# Patient Record
Sex: Female | Born: 1985 | Race: White | Hispanic: No | Marital: Married | Smoking: Former smoker
Health system: Southern US, Community
[De-identification: ages and names within clinical notes are randomized; demographics above are authoritative.]

## PROBLEM LIST (undated history)

## (undated) DIAGNOSIS — K589 Irritable bowel syndrome without diarrhea: Secondary | ICD-10-CM

## (undated) DIAGNOSIS — Z8041 Family history of malignant neoplasm of ovary: Secondary | ICD-10-CM

## (undated) DIAGNOSIS — O24419 Gestational diabetes mellitus in pregnancy, unspecified control: Secondary | ICD-10-CM

## (undated) DIAGNOSIS — J302 Other seasonal allergic rhinitis: Secondary | ICD-10-CM

## (undated) DIAGNOSIS — D696 Thrombocytopenia, unspecified: Secondary | ICD-10-CM

## (undated) DIAGNOSIS — Z1371 Encounter for nonprocreative screening for genetic disease carrier status: Secondary | ICD-10-CM

## (undated) DIAGNOSIS — T753XXA Motion sickness, initial encounter: Secondary | ICD-10-CM

## (undated) HISTORY — DX: Encounter for nonprocreative screening for genetic disease carrier status: Z13.71

## (undated) HISTORY — DX: Family history of malignant neoplasm of ovary: Z80.41

---
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
## 2007-01-15 ENCOUNTER — Emergency Department: Payer: Self-pay | Admitting: Unknown Physician Specialty

## 2007-04-12 ENCOUNTER — Observation Stay: Payer: Self-pay | Admitting: Obstetrics and Gynecology

## 2007-05-26 ENCOUNTER — Observation Stay: Payer: Self-pay | Admitting: Obstetrics and Gynecology

## 2007-05-29 ENCOUNTER — Inpatient Hospital Stay: Payer: Self-pay

## 2008-08-17 IMAGING — US US OB US >=[ID] SNGL FETUS
1 series · 17 of 28 positions shown · non-contrast
Comparison: none

REASON FOR EXAM: Vaginal bleeding
COMMENTS:

[Series 1: us ob us >=(id) sngl fetus · 17 of 55 slices shown]
[im 1/55]
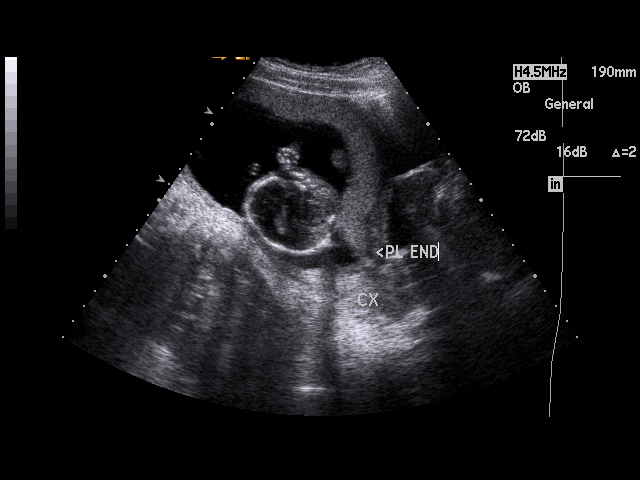
[im 5/55]
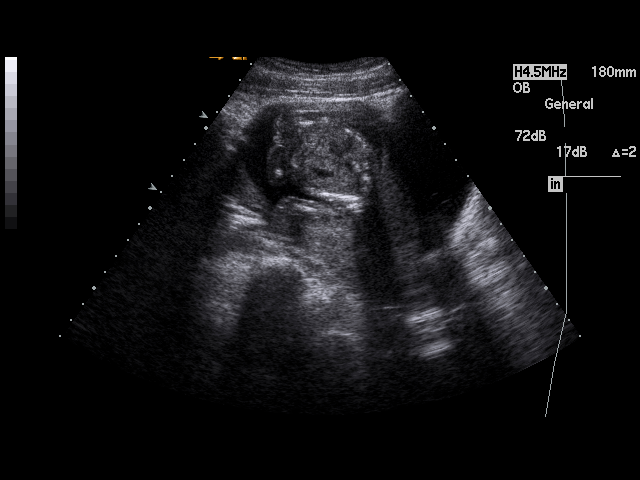
[im 9/55]
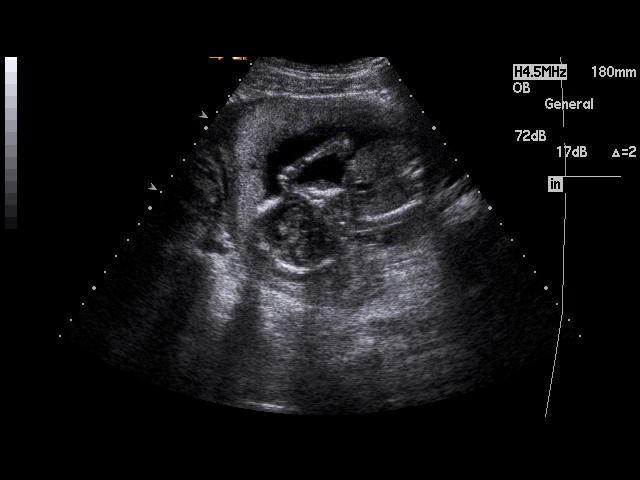
[im 11/55]
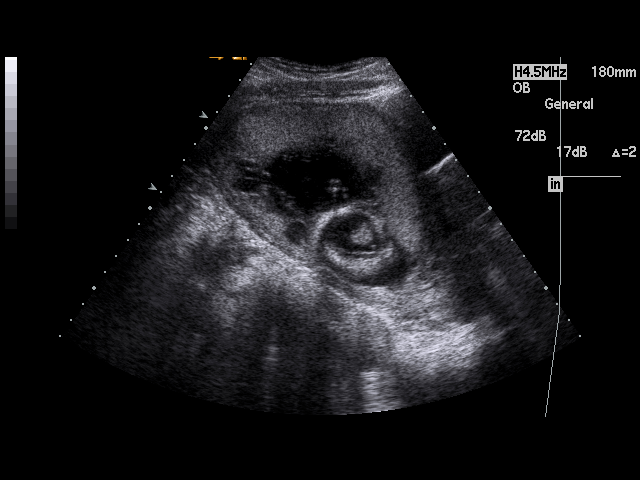
[im 15/55]
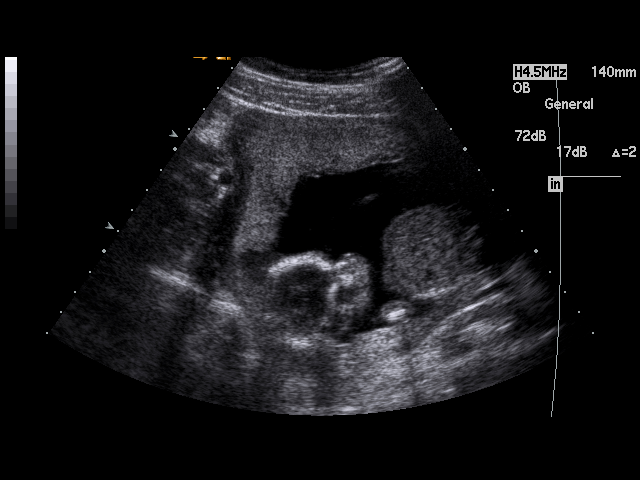
[im 19/55]
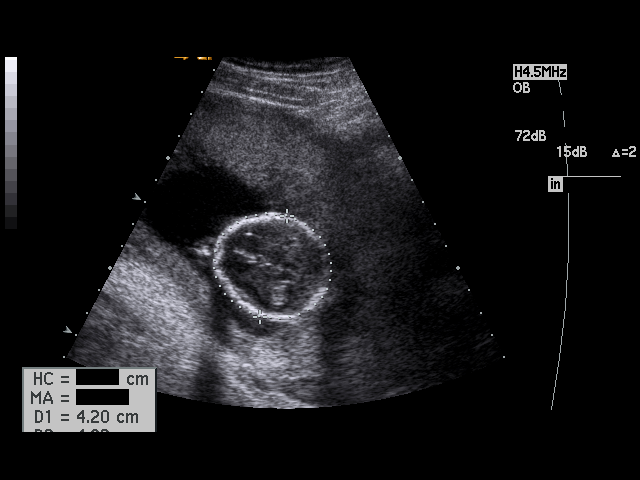
[im 21/55]
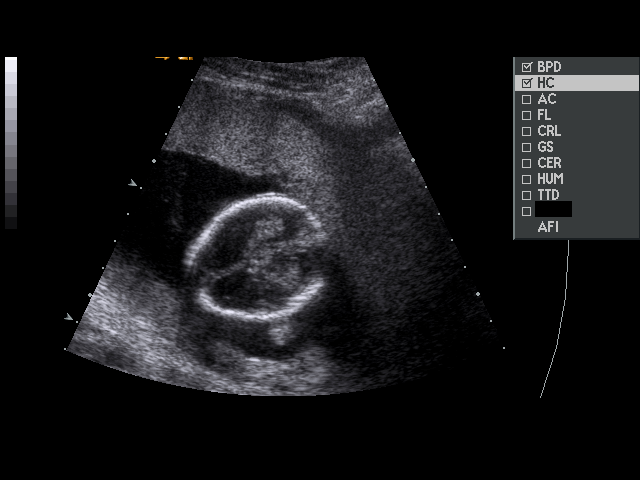
[im 25/55]
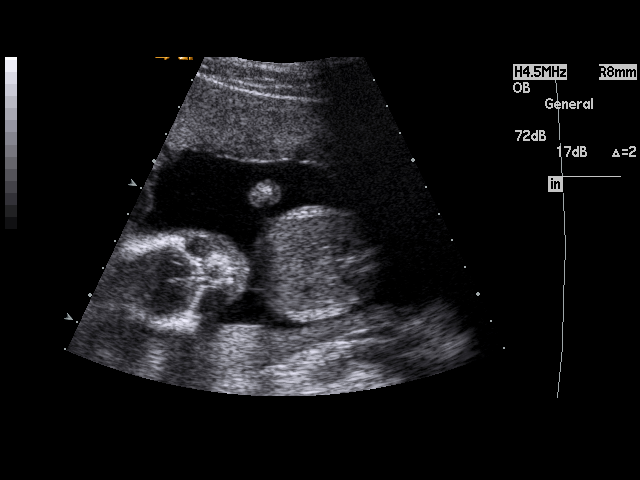
[im 29/55]
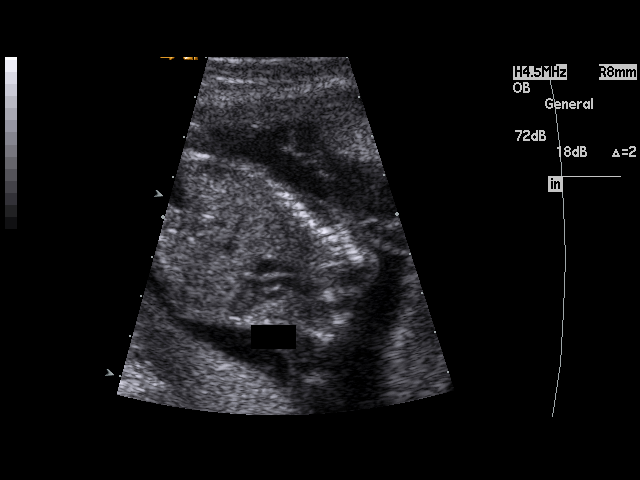
[im 31/55]
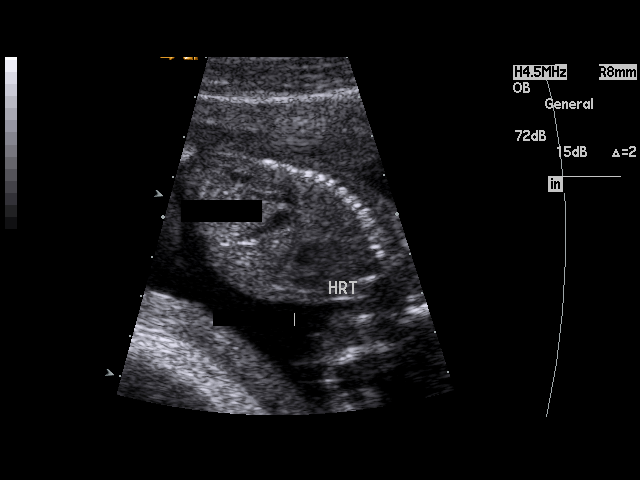
[im 35/55]
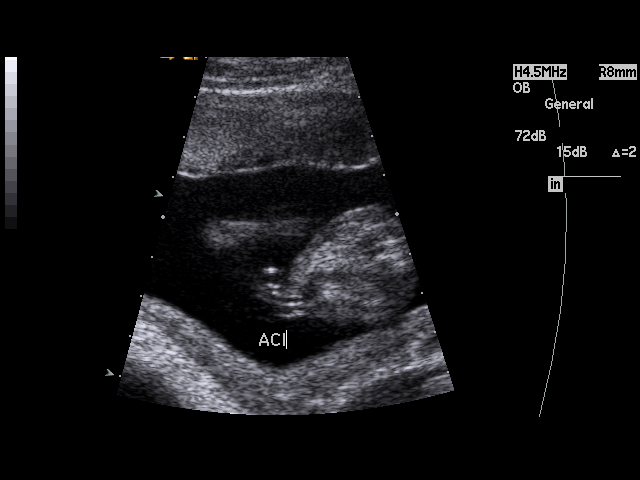
[im 37/55]
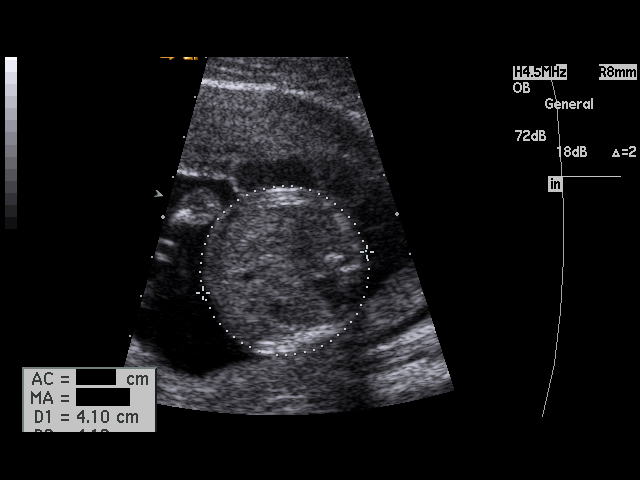
[im 41/55]
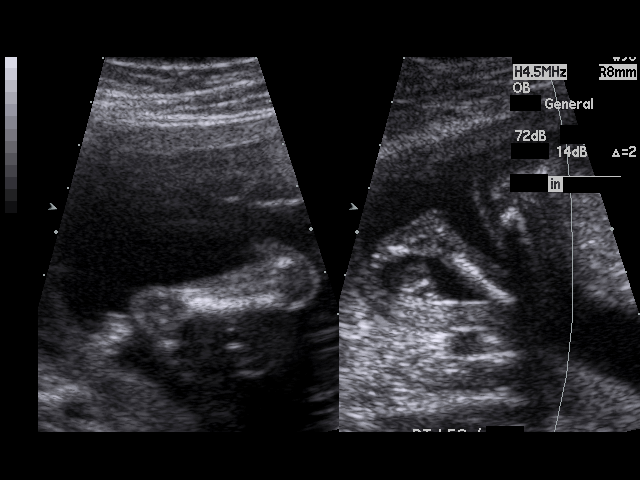
[im 45/55]
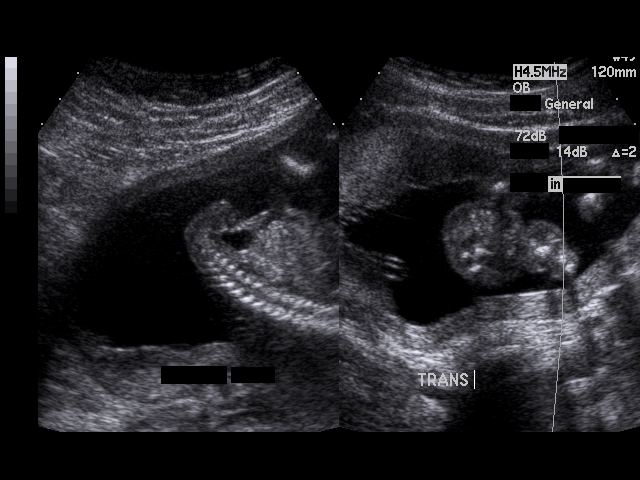
[im 47/55]
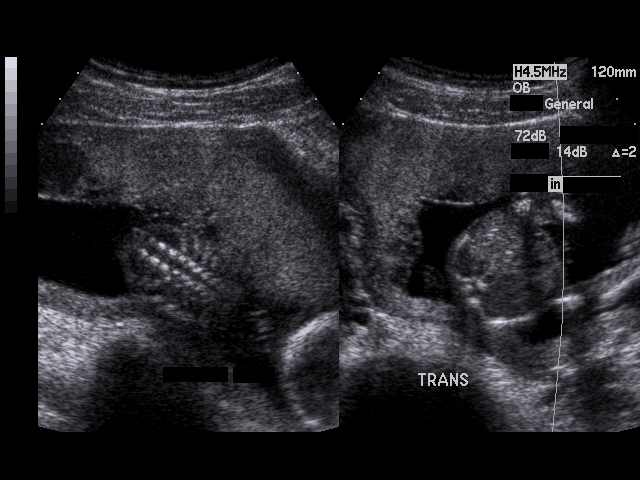
[im 51/55]
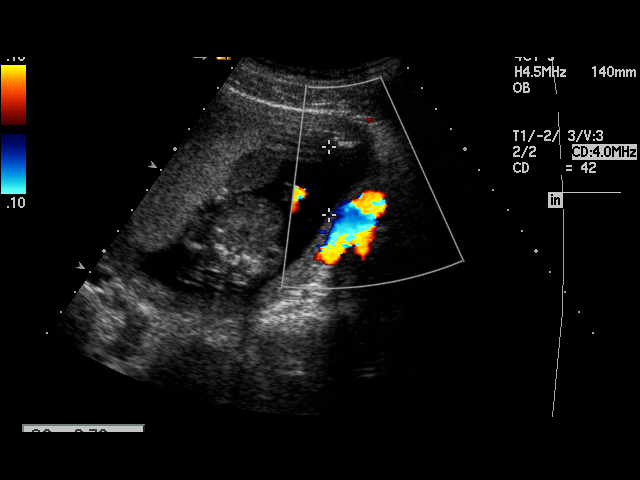
[im 55/55]
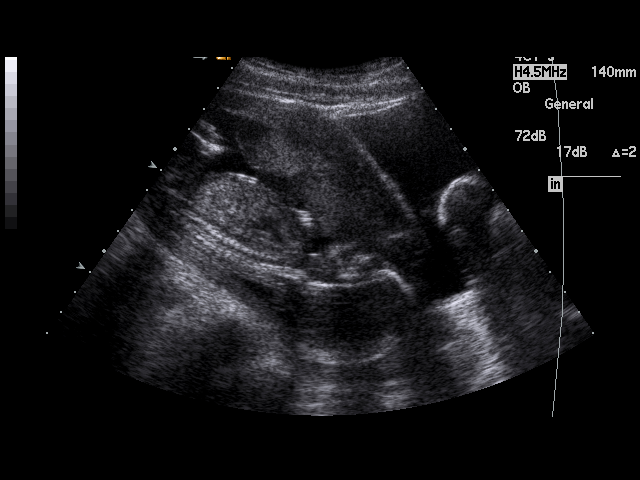

[17 of 28 positions shown; findings below may reference images not displayed]

PROCEDURE:     US  - US OB GREATER/OR EQUAL TO A7X0B  - January 15, 2007 [DATE]

RESULT:     There is a living, intrauterine gestation. Fetal heart rate was
monitored at 144 beats per minute. Amniotic fluid volume appears normal.
Presentation currently is cephalic. The placenta is anterior and extends
inferiorly to the level of the cervix consistent with marginal placenta
previa. The fetal heart, stomach and urinary bladder are visualized. No
hydrocephalus or hydronephrosis is seen.

Fetal measurements are as follows:

          BPD:   3.98 cm  (18 weeks, 1 day)
            HC:  14.18 cm (17 weeks, 3 days)
            AC:  12.91 cm (18 weeks, 3 days)
            FL:     2.72 cm (18 weeks, 2 days)

EFW equals 259 grams. AFI equals 11.77 cm. Average ultrasound age is 18
weeks, 0 days. Ultrasound EDD is 06/18/2007.
IMPRESSION: 1.  Single, living intrauterine gestation of approximately 18 weeks
gestational age.
2.  The placenta is anterior and extends inferiorly to the level of the
cervix consistent with marginal placenta previa. Follow-up examination to
monitor placenta position as the pregnancy progresses is recommended since
so-called placenta migration frequently occurs during the course of
pregnancy.

## 2011-08-06 HISTORY — PX: TUBAL LIGATION: SHX77

## 2012-06-17 ENCOUNTER — Observation Stay: Payer: Self-pay | Admitting: Obstetrics and Gynecology

## 2012-06-17 LAB — URINALYSIS, COMPLETE
Ketone: NEGATIVE
Nitrite: NEGATIVE
Ph: 6 (ref 4.5–8.0)
Protein: NEGATIVE
RBC,UR: 1 /HPF (ref 0–5)
Squamous Epithelial: 13

## 2012-06-18 LAB — URINE CULTURE

## 2012-06-29 ENCOUNTER — Observation Stay: Payer: Self-pay

## 2012-07-07 ENCOUNTER — Observation Stay: Payer: Self-pay | Admitting: Obstetrics and Gynecology

## 2012-07-07 LAB — PIH PROFILE
BUN: 7 mg/dL (ref 7–18)
Chloride: 109 mmol/L — ABNORMAL HIGH (ref 98–107)
Co2: 23 mmol/L (ref 21–32)
Creatinine: 0.57 mg/dL — ABNORMAL LOW (ref 0.60–1.30)
Glucose: 78 mg/dL (ref 65–99)
HCT: 32.9 % — ABNORMAL LOW (ref 35.0–47.0)
MCH: 28.3 pg (ref 26.0–34.0)
MCHC: 34.1 g/dL (ref 32.0–36.0)
Osmolality: 274 (ref 275–301)
Platelet: 86 10*3/uL — ABNORMAL LOW (ref 150–440)
RBC: 3.96 10*6/uL (ref 3.80–5.20)
RDW: 14.2 % (ref 11.5–14.5)
Sodium: 139 mmol/L (ref 136–145)
Uric Acid: 3.7 mg/dL (ref 2.6–6.0)
WBC: 8 10*3/uL (ref 3.6–11.0)

## 2012-07-07 LAB — PROTEIN / CREATININE RATIO, URINE
Creatinine, Urine: 86.3 mg/dL (ref 30.0–125.0)
Protein, Random Urine: 18 mg/dL — ABNORMAL HIGH (ref 0–12)
Protein/Creat. Ratio: 209 mg/gCREAT — ABNORMAL HIGH (ref 0–200)

## 2012-07-07 LAB — URINALYSIS, COMPLETE
Ketone: NEGATIVE
Ph: 6 (ref 4.5–8.0)
Protein: NEGATIVE
Squamous Epithelial: 2

## 2012-07-18 ENCOUNTER — Emergency Department: Payer: Self-pay | Admitting: Emergency Medicine

## 2012-07-18 LAB — COMPREHENSIVE METABOLIC PANEL
Albumin: 3.5 g/dL (ref 3.4–5.0)
Anion Gap: 9 (ref 7–16)
BUN: 10 mg/dL (ref 7–18)
Bilirubin,Total: 0.3 mg/dL (ref 0.2–1.0)
Chloride: 114 mmol/L — ABNORMAL HIGH (ref 98–107)
Co2: 22 mmol/L (ref 21–32)
Creatinine: 0.81 mg/dL (ref 0.60–1.30)
EGFR (African American): >60
EGFR (Non-African Amer.): >60
Osmolality: 289 (ref 275–301)
Potassium: 3.8 mmol/L (ref 3.5–5.1)
SGPT (ALT): 41 U/L (ref 12–78)
Sodium: 145 mmol/L (ref 136–145)
Total Protein: 7.8 g/dL (ref 6.4–8.2)

## 2012-07-18 LAB — URINALYSIS, COMPLETE
Bilirubin,UR: NEGATIVE
Glucose,UR: NEGATIVE mg/dL (ref 0–75)
Nitrite: NEGATIVE
RBC,UR: 1335 /HPF (ref 0–5)
Squamous Epithelial: 12
WBC UR: 44 /HPF (ref 0–5)

## 2012-07-18 LAB — CBC
HCT: 41.4 % (ref 35.0–47.0)
HGB: 13.3 g/dL (ref 12.0–16.0)
MCV: 84 fL (ref 80–100)
Platelet: 240 10*3/uL (ref 150–440)
RBC: 4.94 10*6/uL (ref 3.80–5.20)
RDW: 15 % — ABNORMAL HIGH (ref 11.5–14.5)
WBC: 5.7 10*3/uL (ref 3.6–11.0)

## 2012-07-18 LAB — CLOSTRIDIUM DIFFICILE BY PCR

## 2014-12-13 NOTE — H&P (Signed)
L&D Evaluation:  History:   HPI 29 year old G1 P0101 with EDC=07/21/2012 presents at 5536 6/7 weeks with c/o cold symptoms x 1 mos (congestion, rhinorrhea, sore throat, and cough) that have worsened in the last 4-5 days to also include yellow thick nasal discharge and right ear pain. Baby has been active. Has had more frequent contractions since last week-but mild-with no cervical change. [facility] for GDMA1-has stopped doing her blood sugars at home. She has a past OB history remarkable for preeclampsia at 34 weeks requiring IOL. She denies a history of asthma, but does have seasonal allergies    Presents with URI    Patient's Medical History Preeclampsia first pregnancy. GDMA1 this pregnancy.    Patient's Surgical History Tonsilectomy, lacrimal duct surgery as an infant    Medications Pre Natal Vitamins    Allergies CECLOR    Social History none  Former smoker    Family History Youngest child had cold   ROS:   ROS see HPI   Exam:   Vital Signs stable  98.2-86-20 114/75    Urine Protein trace    General no apparent distress    Mental Status clear    Chest clear    Heart normal sinus rhythm    Abdomen gravid, non-tender    Pelvic no external lesions, 1.5/50%/-2 ballotable per RN exam    Mebranes Intact    FHT normal rate with no decels, 135 baseline with accels to 160s to 170s    FHT Description mod variability    Fetal Heart Rate 135    Ucx absent    Skin dry    Other TMs: fluid behind right TM and no pain when moving pinnea. Left TM with good cone reflex and noninflammed. OP-no inflammation or exudates. Nasal turbinates-inflammed. No maxillary or frontal sinus pressure.   Impression:   Impression IUP at 36 6/7 weeks with URI. Reactive NST.   Plan:   Plan DC home with a Z-PAK. Recommend Mucinex for cough, and Afrin BID x 3-5 days only to help open the ET on the right. Can use saline nasal spray and Vicks also as well as  cough drops. [facility].   Electronic Signatures: Trinna BalloonGutierrez, Mykenzie Ebanks L (CNM)  (Signed [phone]-Nov-13 22:31)  Authored: L&D Evaluation   Last Updated: 25-Nov-13 22:31 by Trinna BalloonGutierrez, Barby Colvard L (CNM)

## 2014-12-13 NOTE — H&P (Signed)
L&D Evaluation:  History Expanded:   HPI 29 year old G2 23P0101 with EDC=07/21/2012 presents at 38 weeks with c/o menstrual-like cramps, back pain, nausea and headache. [facility] with c/o headache, not relieved by Fioricet, ice, B2 and several other measures. [facility] GDMA1 (pt has not been checking her blood sugars at home), she reports she has not kept her prenatal appts in several weeks d/t being unsatisfied with care. Has appt scheduled this Friday. She has a past OB history remarkable for preeclampsia at 34 weeks requiring IOL.    Presents with abdominal pain, back pain    Patient's Medical History Preeclampsia first pregnancy. GDMA1 this pregnancy.    Patient's Surgical History Tonsilectomy, lacrimal duct surgery as an infant    Medications Pre Natal Vitamins    Allergies CECLOR    Social History none  Former smoker   ROS:   ROS see HPI   Exam:   Vital Signs stable    Urine Protein negative dipstick    General no apparent distress    Mental Status clear    Abdomen gravid, non-tender    Back CVAT    Pelvic no external lesions, 2/25/-3    Mebranes Intact    FHT normal rate with no decels, category 1    FHT Description mod variability    Ucx irregular, absent    Skin dry    Other Fasting glucose: 80   Impression:   Impression IUP at 38 weeks with discomforts of term pregnancy   Plan:   Plan UA    Comments Check UA as pt reports flank pain  Will give Zofran and diabetic breakfast tray to see if this helps with c/o headache   Electronic Signatures: Vella KohlerBrothers, Danasia Baker K (CNM)  (Signed 03-Dec-13 11:08)  Authored: L&D Evaluation   Last Updated: 03-Dec-13 11:08 by Vella KohlerBrothers, Antawn Sison K (CNM)

## 2014-12-21 ENCOUNTER — Encounter: Payer: Self-pay | Admitting: *Deleted

## 2014-12-23 ENCOUNTER — Other Ambulatory Visit: Payer: Self-pay | Admitting: Gastroenterology

## 2014-12-23 ENCOUNTER — Encounter
Admission: RE | Disposition: A | Discharge status: Home / Self Care | Payer: Self-pay | Source: Ambulatory Visit | Attending: Gastroenterology

## 2014-12-23 ENCOUNTER — Ambulatory Visit: Payer: Managed Care, Other (non HMO) | Admitting: Anesthesiology

## 2014-12-23 ENCOUNTER — Ambulatory Visit
Admission: RE | Admit: 2014-12-23 | Discharge: 2014-12-23 | Disposition: A | Discharge status: Home / Self Care | Payer: Managed Care, Other (non HMO) | Source: Ambulatory Visit | Attending: Gastroenterology | Admitting: Gastroenterology

## 2014-12-23 DIAGNOSIS — Z833 Family history of diabetes mellitus: Secondary | ICD-10-CM | POA: Insufficient documentation

## 2014-12-23 DIAGNOSIS — K640 First degree hemorrhoids: Secondary | ICD-10-CM | POA: Diagnosis not present

## 2014-12-23 DIAGNOSIS — Z888 Allergy status to other drugs, medicaments and biological substances status: Secondary | ICD-10-CM | POA: Insufficient documentation

## 2014-12-23 DIAGNOSIS — K921 Melena: Secondary | ICD-10-CM | POA: Insufficient documentation

## 2014-12-23 DIAGNOSIS — Z800 Family history of malignant neoplasm of digestive organs: Secondary | ICD-10-CM | POA: Diagnosis not present

## 2014-12-23 DIAGNOSIS — Z808 Family history of malignant neoplasm of other organs or systems: Secondary | ICD-10-CM | POA: Insufficient documentation

## 2014-12-23 DIAGNOSIS — K589 Irritable bowel syndrome without diarrhea: Secondary | ICD-10-CM | POA: Diagnosis present

## 2014-12-23 DIAGNOSIS — Z87891 Personal history of nicotine dependence: Secondary | ICD-10-CM | POA: Diagnosis not present

## 2014-12-23 HISTORY — DX: Motion sickness, initial encounter: T75.3XXA

## 2014-12-23 HISTORY — DX: Irritable bowel syndrome, unspecified: K58.9

## 2014-12-23 HISTORY — DX: Thrombocytopenia, unspecified: D69.6

## 2014-12-23 HISTORY — PX: COLONOSCOPY: SHX5424

## 2014-12-23 HISTORY — DX: Gestational diabetes mellitus in pregnancy, unspecified control: O24.419

## 2014-12-23 HISTORY — DX: Other seasonal allergic rhinitis: J30.2

## 2014-12-23 SURGERY — COLONOSCOPY
Anesthesia: Monitor Anesthesia Care | Wound class: Contaminated

## 2014-12-23 MED ORDER — LACTATED RINGERS IV SOLN
INTRAVENOUS | Status: DC
  Administered 2014-12-23: 11:00:00 via INTRAVENOUS

## 2014-12-23 MED ORDER — ACETAMINOPHEN 325 MG PO TABS: 325.0000 mg | ORAL_TABLET | ORAL | Status: DC | PRN

## 2014-12-23 MED ORDER — PROPOFOL 10 MG/ML IV BOLUS
INTRAVENOUS | Status: DC | PRN
  Administered 2014-12-23 (×2): 40 mg via INTRAVENOUS
  Administered 2014-12-23: 20 mg via INTRAVENOUS
  Administered 2014-12-23: 40 mg via INTRAVENOUS
  Administered 2014-12-23 (×2): 50 mg via INTRAVENOUS

## 2014-12-23 MED ORDER — LIDOCAINE HCL (CARDIAC) 20 MG/ML IV SOLN
INTRAVENOUS | Status: DC | PRN
  Administered 2014-12-23: 50 mg via INTRAVENOUS

## 2014-12-23 MED ORDER — SIMETHICONE 40 MG/0.6ML PO SUSP
ORAL | Status: DC | PRN
  Administered 2014-12-23: 11:00:00

## 2014-12-23 MED ORDER — SODIUM CHLORIDE 0.9 % IV SOLN: INTRAVENOUS | Status: DC

## 2014-12-23 MED ORDER — DEXAMETHASONE SODIUM PHOSPHATE 4 MG/ML IJ SOLN: 8.0000 mg | Freq: Once | INTRAMUSCULAR | Status: DC | PRN

## 2014-12-23 MED ORDER — OXYCODONE HCL 5 MG PO TABS: 5.0000 mg | ORAL_TABLET | Freq: Once | ORAL | Status: DC | PRN

## 2014-12-23 MED ORDER — ACETAMINOPHEN 160 MG/5ML PO SOLN: 325.0000 mg | ORAL | Status: DC | PRN

## 2014-12-23 MED ORDER — OXYCODONE HCL 5 MG/5ML PO SOLN: 5.0000 mg | Freq: Once | ORAL | Status: DC | PRN

## 2014-12-23 MED ORDER — FENTANYL CITRATE (PF) 100 MCG/2ML IJ SOLN
25.0000 ug | INTRAMUSCULAR | Status: DC | PRN
Start: 2014-12-23 — End: 2014-12-23

## 2014-12-23 SURGICAL SUPPLY — 27 items
CANISTER SUCT 1200ML W/VALVE (MISCELLANEOUS) ×3 IMPLANT
FCP ESCP3.2XJMB 240X2.8X (MISCELLANEOUS)
FORCEPS BIOP RAD 4 LRG CAP 4 (CUTTING FORCEPS) ×3 IMPLANT
FORCEPS BIOP RJ4 240 W/NDL (MISCELLANEOUS)
FORCEPS ESCP3.2XJMB 240X2.8X (MISCELLANEOUS) IMPLANT
GOWN CVR UNV OPN BCK APRN NK (MISCELLANEOUS) ×2 IMPLANT
GOWN ISOL THUMB LOOP REG UNIV (MISCELLANEOUS) ×4
HEMOCLIP INSTINCT (CLIP) IMPLANT
INJECTOR VARIJECT VIN23 (MISCELLANEOUS) IMPLANT
KIT CO2 TUBING (TUBING) ×3 IMPLANT
KIT DEFENDO VALVE AND CONN (KITS) IMPLANT
KIT ENDO PROCEDURE OLY (KITS) ×3 IMPLANT
LIGATOR MULTIBAND 6SHOOTER MBL (MISCELLANEOUS) IMPLANT
MARKER SPOT ENDO TATTOO 5ML (MISCELLANEOUS) IMPLANT
PAD GROUND ADULT SPLIT (MISCELLANEOUS) IMPLANT
SNARE SHORT THROW 13M SML OVAL (MISCELLANEOUS) IMPLANT
SNARE SHORT THROW 30M LRG OVAL (MISCELLANEOUS) IMPLANT
SPOT EX ENDOSCOPIC TATTOO (MISCELLANEOUS)
SUCTION POLY TRAP 4CHAMBER (MISCELLANEOUS) IMPLANT
TRAP SUCTION POLY (MISCELLANEOUS) IMPLANT
TUBING CONN 6MMX3.1M (TUBING)
TUBING SUCTION CONN 0.25 STRL (TUBING) IMPLANT
UNDERPAD 30X60 958B10 (PK) (MISCELLANEOUS) IMPLANT
VALVE BIOPSY ENDO (VALVE) IMPLANT
VARIJECT INJECTOR VIN23 (MISCELLANEOUS)
WATER AUXILLARY (MISCELLANEOUS) IMPLANT
WATER STERILE IRR 500ML POUR (IV SOLUTION) ×3 IMPLANT

## 2014-12-23 NOTE — Op Note (Signed)
[facility] Gastroenterology Patient Name: Holly Cox Procedure Date: 12/23/2014 11:13 AM MRN: [id number] Account #: [id number] Date of Birth: 1985/11/29 Admit Type: Outpatient Age: 29 [facility] Gender: Female Note Status: Finalized Procedure:         Colonoscopy Indications:       Hematochezia Providers:         Midge Miniumarren Montina Dorrance, MD Referring MD:      Rona Ravensobert W. Van Dalen, MD (Referring MD) Medicines:         Propofol per Anesthesia Complications:     No immediate complications. Procedure:         Pre-Anesthesia Assessment:                    - Prior to the procedure, a History and Physical was                     performed, and patient medications and allergies were                     reviewed. The patient's tolerance of previous anesthesia                     was also reviewed. The risks and benefits of the procedure                     and the sedation options and risks were discussed with the                     patient. All questions were answered, and informed consent                     was obtained. Prior Anticoagulants: The patient has taken                     no previous anticoagulant or antiplatelet agents. ASA                     Grade Assessment: I - A normal, healthy patient. After                     reviewing the risks and benefits, the patient was deemed                     in satisfactory condition to undergo the procedure.                    After obtaining informed consent, the colonoscope was                     passed under direct vision. Throughout the procedure, the                     patient's blood pressure, pulse, and oxygen saturations                     were monitored continuously. The Olympus CF H180AL                     colonoscope (S#: [id number]) was introduced through the anus                     and advanced to the the terminal ileum. The colonoscopy  was performed without difficulty. The patient  tolerated                     the procedure well. The quality of the bowel preparation                     was excellent. Findings:      The perianal and digital rectal examinations were normal.      The terminal ileum appeared normal. Biopsies were taken with a cold       forceps for histology.      Non-bleeding internal hemorrhoids were found during retroflexion. The       hemorrhoids were Grade I (internal hemorrhoids that do not prolapse).      Random biopsies were obtained with cold forceps for histology randomly       in the entire colon. Impression:        - The examined portion of the ileum was normal. Biopsied.                    - Non-bleeding internal hemorrhoids.                    - Random biopsies were obtained in the entire colon. Recommendation:    - Await pathology results. Procedure Code(s): --- Professional ---                    [phone], Colonoscopy, flexible; with biopsy, single or                     multiple Diagnosis Code(s): --- Professional ---                    K92.1, Melena CPT copyright 2014 American Medical Association. All rights reserved. The codes documented in this report are preliminary and upon coder review may  be revised to meet current compliance requirements. Midge Miniumarren Ausar Georgiou, MD 12/23/2014 11:38:41 AM This report has been signed electronically. Number of Addenda: 0 Note Initiated On: 12/23/2014 11:13 AM Scope Withdrawal Time: 0 hours 6 minutes 56 seconds  Total Procedure Duration: 0 hours 9 minutes 52 seconds       [facility]

## 2014-12-23 NOTE — Anesthesia Preprocedure Evaluation (Signed)
Anesthesia Evaluation  Patient identified by MRN, date of birth, ID band Patient awake    Reviewed: Allergy & Precautions, H&P , NPO status , Patient's Chart, lab work & pertinent test results, reviewed documented beta blocker date and time   Airway Mallampati: II  TM Distance: >3 FB Neck ROM: full    Dental no notable dental hx.    Pulmonary former smoker,  breath sounds clear to auscultation  Pulmonary exam normal       Cardiovascular Exercise Tolerance: Good negative cardio ROS  Rhythm:regular Rate:Normal     Neuro/Psych negative neurological ROS  negative psych ROS   GI/Hepatic negative GI ROS, Neg liver ROS,   Endo/Other  diabetes, Gestational  Renal/GU negative Renal ROS  negative genitourinary   Musculoskeletal   Abdominal   Peds  Hematology negative hematology ROS (+)   Anesthesia Other Findings   Reproductive/Obstetrics negative OB ROS                             Anesthesia Physical Anesthesia Plan  ASA: I  Anesthesia Plan: MAC   Post-op Pain Management:    Induction:   Airway Management Planned:   Additional Equipment:   Intra-op Plan:   Post-operative Plan:   Informed Consent: I have reviewed the patients History and Physical, chart, labs and discussed the procedure including the risks, benefits and alternatives for the proposed anesthesia with the patient or authorized representative who has indicated his/her understanding and acceptance.   Dental Advisory Given  Plan Discussed with: CRNA  Anesthesia Plan Comments:         Anesthesia Quick Evaluation

## 2014-12-23 NOTE — Anesthesia Postprocedure Evaluation (Signed)
  Anesthesia Post-op Note  Patient: Holly Cox  Procedure(s) Performed: Procedure(s): COLONOSCOPY (N/A)  Anesthesia type:MAC  Patient location: PACU  Post pain: Pain level controlled  Post assessment: Post-op Vital signs reviewed, Patient's Cardiovascular Status Stable, Respiratory Function Stable, Patent Airway and No signs of Nausea or vomiting  Post vital signs: Reviewed and stable  Last Vitals:  Filed Vitals:   12/23/14 1200  BP:   Pulse:   Temp: 36.5 C  Resp:     Level of consciousness: awake, alert  and patient cooperative  Complications: No apparent anesthesia complications

## 2014-12-23 NOTE — Discharge Instructions (Signed)

## 2014-12-23 NOTE — H&P (Signed)
  [facility]  [address] [place], [place] [address] Phone: [phone] Fax : [phone]  Primary Care Physician:  No PCP Per Patient Primary Gastroenterologist:  Dr. Servando SnareWohl  Pre-Procedure History & Physical: HPI:  Holly Cox is a 29 y.o. female is here for an colonoscopy.   Past Medical History  Diagnosis Date  . IBS (irritable bowel syndrome)   . Thrombocytopenia     while pregnant  . Gestational diabetes   . Seasonal allergies   . Motion sickness     Rare    Past Surgical History  Procedure Laterality Date  . Adenoidectomy  2006  . Tubal ligation Bilateral 2013  . Tonsillectomy  2006  . Wisdom tooth extraction  2005    Prior to Admission medications   Not on File    Allergies as of 12/21/2014 - Review Complete 12/21/2014  Allergen Reaction Noted  . Ceclor [cefaclor] Swelling 12/21/2014    History reviewed. No pertinent family history.  History   Social History  . Marital Status: Married    Spouse Name: N/A  . Number of Children: N/A  . Years of Education: N/A   Occupational History  . Not on file.   Social History Main Topics  . Smoking status: Former Smoker    Quit date: 08/05/2006  . Smokeless tobacco: Not on file  . Alcohol Use: 3.0 oz/week    5 Cans of beer per week  . Drug Use: Not on file  . Sexual Activity: Not on file   Other Topics Concern  . Not on file   Social History Narrative  . No narrative on file    Review of Systems: See HPI, otherwise negative ROS  Physical Exam: BP 102/73 mmHg  Pulse 66  Temp(Src) 97.9 F (36.6 C) (Temporal)  Resp 16  Ht 5\' 4"  (1.626 m)  Wt 129 lb (58.514 kg)  BMI 22.13 kg/m2  SpO2 100%  LMP 12/05/2014 (Approximate) General:   Alert,  pleasant and cooperative in NAD Head:  Normocephalic and atraumatic. Neck:  Supple; no masses or thyromegaly. Lungs:  Clear throughout to auscultation.    Heart:  Regular rate and rhythm. Abdomen:  Soft, nontender and nondistended.  Normal bowel sounds, without guarding, and without rebound.   Neurologic:  Alert and  oriented x4;  grossly normal neurologically.  Impression/Plan: Holly Cox is here for an colonoscopy to be performed for Fam histroy colon cancer and rectal bleeding  Risks, benefits, limitations, and alternatives regarding  colonoscopy have been reviewed with the patient.  Questions have been answered.  All parties agreeable.   [facility],Nazario Russom, MD  12/23/2014, 11:09 AM

## 2014-12-23 NOTE — Transfer of Care (Signed)
Immediate Anesthesia Transfer of Care Note  Patient: Holly SmallAnna Cox  Procedure(s) Performed: Procedure(s): COLONOSCOPY (N/A)  Patient Location: PACU  Anesthesia Type: MAC  Level of Consciousness: awake, alert  and patient cooperative  Airway and Oxygen Therapy: Patient Spontanous Breathing and Patient connected to supplemental oxygen  Post-op Assessment: Post-op Vital signs reviewed, Patient's Cardiovascular Status Stable, Respiratory Function Stable, Patent Airway and No signs of Nausea or vomiting  Post-op Vital Signs: Reviewed and stable  Complications: No apparent anesthesia complications

## 2014-12-24 ENCOUNTER — Encounter: Payer: Self-pay | Admitting: Gastroenterology

## 2016-05-06 LAB — HM PAP SMEAR: HM Pap smear: NORMAL

## 2016-10-18 ENCOUNTER — Other Ambulatory Visit: Payer: Self-pay | Admitting: Obstetrics and Gynecology

## 2016-10-18 NOTE — Telephone Encounter (Signed)
Holly SquibbJane last saw pt 10/17.

## 2017-02-17 ENCOUNTER — Telehealth: Payer: Self-pay

## 2017-02-17 ENCOUNTER — Other Ambulatory Visit: Payer: Self-pay | Admitting: Advanced Practice Midwife

## 2017-02-17 NOTE — Telephone Encounter (Signed)
Annual was 05/2016, no medication follow up appointment has been made. Please advise

## 2017-02-17 NOTE — Telephone Encounter (Signed)
Rx refill on Alprazolam, request made by pharmacy. I tried to call patient to make her aware of Rx being ready for pick at front desk. Unable to get through on phone d/t busy tone. KJ CMA

## 2017-03-06 ENCOUNTER — Other Ambulatory Visit: Payer: Self-pay | Admitting: Advanced Practice Midwife

## 2017-03-12 ENCOUNTER — Other Ambulatory Visit: Payer: Self-pay | Admitting: Advanced Practice Midwife

## 2017-03-13 NOTE — Telephone Encounter (Signed)
Trying to get in touch with pt, refill was done 8/2 by jane. Cannot get in touch with pt d/t phone numbers not working. Let me know if she calls back

## 2017-05-05 DIAGNOSIS — Z1371 Encounter for nonprocreative screening for genetic disease carrier status: Secondary | ICD-10-CM

## 2017-05-05 HISTORY — DX: Encounter for nonprocreative screening for genetic disease carrier status: Z13.71

## 2017-05-15 ENCOUNTER — Ambulatory Visit (INDEPENDENT_AMBULATORY_CARE_PROVIDER_SITE_OTHER): Payer: BLUE CROSS/BLUE SHIELD | Admitting: Advanced Practice Midwife

## 2017-05-15 ENCOUNTER — Encounter: Payer: Self-pay | Admitting: Advanced Practice Midwife

## 2017-05-15 VITALS — BP 114/70 | Ht 64.0 in | Wt 143.0 lb

## 2017-05-15 DIAGNOSIS — Z0000 Encounter for general adult medical examination without abnormal findings: Secondary | ICD-10-CM | POA: Diagnosis not present

## 2017-05-15 DIAGNOSIS — Z01419 Encounter for gynecological examination (general) (routine) without abnormal findings: Secondary | ICD-10-CM

## 2017-05-15 DIAGNOSIS — Z800 Family history of malignant neoplasm of digestive organs: Secondary | ICD-10-CM | POA: Diagnosis not present

## 2017-05-15 DIAGNOSIS — Z8041 Family history of malignant neoplasm of ovary: Secondary | ICD-10-CM | POA: Diagnosis not present

## 2017-05-15 NOTE — Patient Instructions (Signed)
Preventive Care 18-39 Years, Female Preventive care refers to lifestyle choices and visits with your health care provider that can promote health and wellness. What does preventive care include?  A yearly physical exam. This is also called an annual well check.  Dental exams once or twice a year.  Routine eye exams. Ask your health care provider how often you should have your eyes checked.  Personal lifestyle choices, including: ? Daily care of your teeth and gums. ? Regular physical activity. ? Eating a healthy diet. ? Avoiding tobacco and drug use. ? Limiting alcohol use. ? Practicing safe sex. ? Taking vitamin and mineral supplements as recommended by your health care provider. What happens during an annual well check? The services and screenings done by your health care provider during your annual well check will depend on your age, overall health, lifestyle risk factors, and family history of disease. Counseling Your health care provider may ask you questions about your:  Alcohol use.  Tobacco use.  Drug use.  Emotional well-being.  Home and relationship well-being.  Sexual activity.  Eating habits.  Work and work Statistician.  Method of birth control.  Menstrual cycle.  Pregnancy history.  Screening You may have the following tests or measurements:  Height, weight, and BMI.  Diabetes screening. This is done by checking your blood sugar (glucose) after you have not eaten for a while (fasting).  Blood pressure.  Lipid and cholesterol levels. These may be checked every 5 years starting at age 66.  Skin check.  Hepatitis C blood test.  Hepatitis B blood test.  Sexually transmitted disease (STD) testing.  BRCA-related cancer screening. This may be done if you have a family history of breast, ovarian, tubal, or peritoneal cancers.  Pelvic exam and Pap test. This may be done every 3 years starting at age 40. Starting at age 59, this may be done every 5  years if you have a Pap test in combination with an HPV test.  Discuss your test results, treatment options, and if necessary, the need for more tests with your health care provider. Vaccines Your health care provider may recommend certain vaccines, such as:  Influenza vaccine. This is recommended every year.  Tetanus, diphtheria, and acellular pertussis (Tdap, Td) vaccine. You may need a Td booster every 10 years.  Varicella vaccine. You may need this if you have not been vaccinated.  HPV vaccine. If you are 69 or younger, you may need three doses over 6 months.  Measles, mumps, and rubella (MMR) vaccine. You may need at least one dose of MMR. You may also need a second dose.  Pneumococcal 13-valent conjugate (PCV13) vaccine. You may need this if you have certain conditions and were not previously vaccinated.  Pneumococcal polysaccharide (PPSV23) vaccine. You may need one or two doses if you smoke cigarettes or if you have certain conditions.  Meningococcal vaccine. One dose is recommended if you are age 27-21 years and a first-year college student living in a residence hall, or if you have one of several medical conditions. You may also need additional booster doses.  Hepatitis A vaccine. You may need this if you have certain conditions or if you travel or work in places where you may be exposed to hepatitis A.  Hepatitis B vaccine. You may need this if you have certain conditions or if you travel or work in places where you may be exposed to hepatitis B.  Haemophilus influenzae type b (Hib) vaccine. You may need this if  you have certain risk factors.  Talk to your health care provider about which screenings and vaccines you need and how often you need them. This information is not intended to replace advice given to you by your health care provider. Make sure you discuss any questions you have with your health care provider. Document Released: 09/17/2001 Document Revised: 04/10/2016  Document Reviewed: 05/23/2015 Elsevier Interactive Patient Education  2017 [employer].

## 2017-05-15 NOTE — Progress Notes (Signed)
Patient ID: Holly Cox, female   DOB: 07-04-1986, 31 y.o.   MRN: [id number]     Gynecology Annual Exam  PCP: Patient, No Pcp Per  Chief Complaint:  Chief Complaint  Patient presents with  . Annual Exam    History of Present Illness: Patient is a 31 y.o. [id number] presents for annual exam. The patient has no complaints today. She is interested in completing the My Risk screening based on her family history of ovarian cancer in her paternal grandmother.   LMP: Patient's last menstrual period was 05/07/2017. Average Interval: regular, 28 days Duration of flow: 5 days Heavy Menses: varies Clots: yes Intermenstrual Bleeding: no Postcoital Bleeding: no Dysmenorrhea: no  The patient is sexually active. She currently uses tubal ligation for contraception. She denies dyspareunia.  The patient does perform self breast exams.  There is notable family history of breast or ovarian cancer in her family.  The patient wears seatbelts: yes.   The patient has regular exercise: yes.    The patient denies current symptoms of depression.    Review of Systems: Review of Systems  Constitutional: Negative.   HENT: Negative.   Eyes: Negative.   Respiratory: Negative.   Cardiovascular: Negative.   Gastrointestinal: Negative.   Genitourinary: Negative.   Musculoskeletal: Negative.   Skin: Negative.   Neurological: Negative.   Endo/Heme/Allergies: Negative.   Psychiatric/Behavioral: Negative.     Past Medical History:  Past Medical History:  Diagnosis Date  . Gestational diabetes   . IBS (irritable bowel syndrome)   . Motion sickness    Rare  . Seasonal allergies   . Thrombocytopenia (HCC)    while pregnant    Past Surgical History:  Past Surgical History:  Procedure Laterality Date  . ADENOIDECTOMY  2006  . COLONOSCOPY N/A 12/23/2014   Procedure: COLONOSCOPY;  Surgeon: Midge Minium, MD;  Location: [facility];  Service: Gastroenterology;  Laterality: N/A;  .  TONSILLECTOMY  2006  . TUBAL LIGATION Bilateral 2013  . WISDOM TOOTH EXTRACTION  2005    Gynecologic History:  Patient's last menstrual period was 05/07/2017. Contraception: tubal ligation Last Pap: Results were: no abnormalities   Obstetric History: G2P0202  Family History:  Family History  Problem Relation Age of Onset  . Heart attack Mother   . Colon cancer Mother   . Diabetes Mellitus II Paternal Grandmother   . Ovarian cancer Paternal Grandmother     Social History:  Social History   Social History  . Marital status: Married    Spouse name: N/A  . Number of children: N/A  . Years of education: N/A   Occupational History  . Not on file.   Social History Main Topics  . Smoking status: Former Smoker    Quit date: 08/05/2006  . Smokeless tobacco: Not on file  . Alcohol use 3.0 oz/week    5 Cans of beer per week  . Drug use: Unknown  . Sexual activity: Yes    Birth control/ protection: Surgical   Other Topics Concern  . Not on file   Social History Narrative  . No narrative on file    Allergies:  Allergies  Allergen Reactions  . Ceclor [Cefaclor] Swelling  . Neosporin [Neomycin-Bacitracin Zn-Polymyx]     Medications: Prior to Admission medications   Medication Sig Start Date End Date Taking? Authorizing Provider  ALPRAZolam (XANAX) 0.25 MG tablet TAKE 1 TABLET BY MOUTH 3 TIMES A DAY DURING MENSTRUAL CYCLE Patient not taking: Reported on 05/15/2017  03/06/17   Tresea Mall, CNM    Physical Exam Vitals: Blood pressure 114/70, height  (1.626 m), weight 143 lb (64.9 kg), last menstrual period 05/07/2017.  General: NAD HEENT: normocephalic, anicteric Thyroid: no enlargement, no palpable nodules Pulmonary: No increased work of breathing, CTAB Cardiovascular: RRR, distal pulses 2+ Breast: Breast symmetrical, no tenderness, no palpable nodules or masses, no skin or nipple retraction present, no nipple discharge.  No axillary or supraclavicular  lymphadenopathy. Abdomen: NABS, soft, non-tender, non-distended.  Umbilicus without lesions.  No hepatomegaly, splenomegaly or masses palpable. No evidence of hernia  Genitourinary:  Deferred for no concerns and PAP screening interval Extremities: no edema, erythema, or tenderness Neurologic: Grossly intact Psychiatric: mood appropriate, affect full   Assessment: 31 y.o. G2P0202 routine annual exam  Plan: Problem List Items Addressed This Visit    None    Visit Diagnoses    Well woman exam (no gynecological exam)    -  Primary   Relevant Orders   Hgb A1c w/o eAG   Blood tests for routine general physical examination       Relevant Orders   Hgb A1c w/o eAG      1) STI screening was offered and declined  2) ASCCP guidelines and rational discussed.  Patient opts for every 2 year screening interval  3) Contraception - BTL  4) Routine healthcare maintenance including cholesterol, diabetes screening discussed: Hgb A1C Ordered today  5) Follow up 1 year for routine annual exam  Tresea Mall, CNM

## 2017-05-19 ENCOUNTER — Telehealth: Payer: Self-pay

## 2017-05-19 NOTE — Telephone Encounter (Signed)
Pt calling for A1c results.  [phone] okay to leave vm.

## 2017-05-20 ENCOUNTER — Encounter: Payer: Self-pay | Admitting: Advanced Practice Midwife

## 2017-05-21 LAB — HGB A1C W/O EAG: HEMOGLOBIN A1C: 5 % (ref 4.8–5.6)

## 2017-05-27 ENCOUNTER — Encounter: Payer: Self-pay | Admitting: Obstetrics and Gynecology

## 2017-08-20 ENCOUNTER — Other Ambulatory Visit: Payer: Self-pay | Admitting: Advanced Practice Midwife

## 2017-12-29 DIAGNOSIS — R188 Other ascites: Secondary | ICD-10-CM | POA: Diagnosis not present

## 2017-12-29 DIAGNOSIS — R110 Nausea: Secondary | ICD-10-CM | POA: Diagnosis not present

## 2017-12-29 DIAGNOSIS — R1031 Right lower quadrant pain: Secondary | ICD-10-CM | POA: Diagnosis not present

## 2017-12-29 DIAGNOSIS — Z9851 Tubal ligation status: Secondary | ICD-10-CM | POA: Diagnosis not present

## 2017-12-29 DIAGNOSIS — R9389 Abnormal findings on diagnostic imaging of other specified body structures: Secondary | ICD-10-CM | POA: Diagnosis not present

## 2017-12-29 DIAGNOSIS — R197 Diarrhea, unspecified: Secondary | ICD-10-CM | POA: Diagnosis not present

## 2017-12-29 DIAGNOSIS — K529 Noninfective gastroenteritis and colitis, unspecified: Secondary | ICD-10-CM | POA: Diagnosis not present

## 2017-12-29 DIAGNOSIS — R109 Unspecified abdominal pain: Secondary | ICD-10-CM | POA: Diagnosis not present

## 2017-12-29 DIAGNOSIS — R911 Solitary pulmonary nodule: Secondary | ICD-10-CM | POA: Diagnosis not present

## 2018-01-08 ENCOUNTER — Other Ambulatory Visit: Payer: Self-pay | Admitting: Advanced Practice Midwife

## 2018-01-20 ENCOUNTER — Other Ambulatory Visit: Payer: Self-pay | Admitting: Advanced Practice Midwife

## 2018-01-21 NOTE — Telephone Encounter (Signed)
Attempted to call patient regarding plan of care and need for refill of Rx. Voice mailbox is full.

## 2018-03-10 ENCOUNTER — Telehealth: Payer: Self-pay

## 2018-03-10 NOTE — Telephone Encounter (Signed)
Pt refill was denied.  What does she need to do?  [phone]

## 2018-03-10 NOTE — Telephone Encounter (Signed)
Spoke to patient. Dala Dockdvised Jane attempted to contact her on 06/19 in reference to plan of treatment and Rx refill but vmail was full. Erskine SquibbJane not in office today will fwd message to advise on return. Pt verbalized understanding.

## 2018-03-12 ENCOUNTER — Other Ambulatory Visit: Payer: Self-pay | Admitting: Advanced Practice Midwife

## 2018-03-12 DIAGNOSIS — F411 Generalized anxiety disorder: Secondary | ICD-10-CM

## 2018-03-12 MED ORDER — ALPRAZOLAM 0.25 MG PO TABS: ORAL_TABLET | ORAL | 2 refills | Status: DC

## 2018-03-12 NOTE — Progress Notes (Signed)
Reorder of Xanax with 2 refills sent to patient pharmacy.

## 2018-03-13 NOTE — Telephone Encounter (Signed)
Left voicemail message regarding Rx sent yesterday.

## 2018-04-14 DIAGNOSIS — J029 Acute pharyngitis, unspecified: Secondary | ICD-10-CM | POA: Diagnosis not present

## 2018-04-14 DIAGNOSIS — J069 Acute upper respiratory infection, unspecified: Secondary | ICD-10-CM | POA: Diagnosis not present

## 2018-05-27 ENCOUNTER — Other Ambulatory Visit (HOSPITAL_COMMUNITY)
Admission: RE | Admit: 2018-05-27 | Discharge: 2018-05-27 | Disposition: A | Discharge status: Home / Self Care | Payer: BLUE CROSS/BLUE SHIELD | Source: Ambulatory Visit | Attending: Advanced Practice Midwife | Admitting: Advanced Practice Midwife

## 2018-05-27 ENCOUNTER — Encounter: Payer: Self-pay | Admitting: Advanced Practice Midwife

## 2018-05-27 ENCOUNTER — Ambulatory Visit (INDEPENDENT_AMBULATORY_CARE_PROVIDER_SITE_OTHER): Payer: BLUE CROSS/BLUE SHIELD | Admitting: Advanced Practice Midwife

## 2018-05-27 VITALS — BP 120/78 | HR 94 | Ht 64.0 in | Wt 136.0 lb

## 2018-05-27 DIAGNOSIS — Z113 Encounter for screening for infections with a predominantly sexual mode of transmission: Secondary | ICD-10-CM | POA: Diagnosis not present

## 2018-05-27 DIAGNOSIS — R079 Chest pain, unspecified: Secondary | ICD-10-CM

## 2018-05-27 DIAGNOSIS — N898 Other specified noninflammatory disorders of vagina: Secondary | ICD-10-CM

## 2018-05-27 DIAGNOSIS — Z01419 Encounter for gynecological examination (general) (routine) without abnormal findings: Secondary | ICD-10-CM | POA: Diagnosis not present

## 2018-05-27 NOTE — Patient Instructions (Addendum)
Preventive Care 18-39 Years, Female Preventive care refers to lifestyle choices and visits with your health care provider that can promote health and wellness. What does preventive care include?  A yearly physical exam. This is also called an annual well check.  Dental exams once or twice a year.  Routine eye exams. Ask your health care provider how often you should have your eyes checked.  Personal lifestyle choices, including: ? Daily care of your teeth and gums. ? Regular physical activity. ? Eating a healthy diet. ? Avoiding tobacco and drug use. ? Limiting alcohol use. ? Practicing safe sex. ? Taking vitamin and mineral supplements as recommended by your health care provider. What happens during an annual well check? The services and screenings done by your health care provider during your annual well check will depend on your age, overall health, lifestyle risk factors, and family history of disease. Counseling Your health care provider may ask you questions about your:  Alcohol use.  Tobacco use.  Drug use.  Emotional well-being.  Home and relationship well-being.  Sexual activity.  Eating habits.  Work and work Statistician.  Method of birth control.  Menstrual cycle.  Pregnancy history.  Screening You may have the following tests or measurements:  Height, weight, and BMI.  Diabetes screening. This is done by checking your blood sugar (glucose) after you have not eaten for a while (fasting).  Blood pressure.  Lipid and cholesterol levels. These may be checked every 5 years starting at age 38.  Skin check.  Hepatitis C blood test.  Hepatitis B blood test.  Sexually transmitted disease (STD) testing.  BRCA-related cancer screening. This may be done if you have a family history of breast, ovarian, tubal, or peritoneal cancers.  Pelvic exam and Pap test. This may be done every 3 years starting at age 38. Starting at age 30, this may be done  every 5 years if you have a Pap test in combination with an HPV test.  Discuss your test results, treatment options, and if necessary, the need for more tests with your health care provider. Vaccines Your health care provider may recommend certain vaccines, such as:  Influenza vaccine. This is recommended every year.  Tetanus, diphtheria, and acellular pertussis (Tdap, Td) vaccine. You may need a Td booster every 10 years.  Varicella vaccine. You may need this if you have not been vaccinated.  HPV vaccine. If you are 39 or younger, you may need three doses over 6 months.  Measles, mumps, and rubella (MMR) vaccine. You may need at least one dose of MMR. You may also need a second dose.  Pneumococcal 13-valent conjugate (PCV13) vaccine. You may need this if you have certain conditions and were not previously vaccinated.  Pneumococcal polysaccharide (PPSV23) vaccine. You may need one or two doses if you smoke cigarettes or if you have certain conditions.  Meningococcal vaccine. One dose is recommended if you are age 68-21 years and a first-year college student living in a residence hall, or if you have one of several medical conditions. You may also need additional booster doses.  Hepatitis A vaccine. You may need this if you have certain conditions or if you travel or work in places where you may be exposed to hepatitis A.  Hepatitis B vaccine. You may need this if you have certain conditions or if you travel or work in places where you may be exposed to hepatitis B.  Haemophilus influenzae type b (Hib) vaccine. You may need this  if you have certain risk factors.  Talk to your health care provider about which screenings and vaccines you need and how often you need them. This information is not intended to replace advice given to you by your health care provider. Make sure you discuss any questions you have with your health care provider. Document Released: 09/17/2001 Document Revised:  04/10/2016 Document Reviewed: 05/23/2015 Elsevier Interactive Patient Education  2018 [employer].  Vaginitis Vaginitis is a condition in which the vaginal tissue swells and becomes red (inflamed). This condition is most often caused by a change in the normal balance of bacteria and yeast that live in the vagina. This change causes an overgrowth of certain bacteria or yeast, which causes the inflammation. There are different types of vaginitis, but the most common types are:  Bacterial vaginosis.  Yeast infection (candidiasis).  Trichomoniasis vaginitis. This is a sexually transmitted disease (STD).  Viral vaginitis.  Atrophic vaginitis.  Allergic vaginitis.  What are the causes? The cause of this condition depends on the type of vaginitis. It can be caused by:  Bacteria (bacterial vaginosis).  Yeast, which is a fungus (yeast infection).  A parasite (trichomoniasis vaginitis).  A virus (viral vaginitis).  Low hormone levels (atrophic vaginitis). Low hormone levels can occur during pregnancy, breastfeeding, or after menopause.  Irritants, such as bubble baths, scented tampons, and feminine sprays (allergic vaginitis).  Other factors can change the normal balance of the yeast and bacteria that live in the vagina. These include:  Antibiotic medicines.  Poor hygiene.  Diaphragms, vaginal sponges, spermicides, birth control pills, and intrauterine devices (IUD).  Sex.  Infection.  Uncontrolled diabetes.  A weakened defense (immune) system.  What increases the risk? This condition is more likely to develop in women who:  Smoke.  Use vaginal douches, scented tampons, or scented sanitary pads.  Wear tight-fitting pants.  Wear thong underwear.  Use oral birth control pills or an IUD.  Have sex without a condom.  Have multiple sex partners.  Have an STD.  Frequently use the spermicide nonoxynol-9.  Eat lots of foods high in sugar.  Have uncontrolled  diabetes.  Have low estrogen levels.  Have a weakened immune system from an immune disorder or medical treatment.  Are pregnant or breastfeeding.  What are the signs or symptoms? Symptoms vary depending on the cause of the vaginitis. Common symptoms include:  Abnormal vaginal discharge. ? The discharge is white, gray, or yellow with bacterial vaginosis. ? The discharge is thick, white, and cheesy with a yeast infection. ? The discharge is frothy and yellow or greenish with trichomoniasis.  A bad vaginal smell. The smell is fishy with bacterial vaginosis.  Vaginal itching, pain, or swelling.  Sex that is painful.  Pain or burning when urinating.  Sometimes there are no symptoms. How is this diagnosed? This condition is diagnosed based on your symptoms and medical history. A physical exam, including a pelvic exam, will also be done. You may also have other tests, including:  Tests to determine the pH level (acidity or alkalinity) of your vagina.  A whiff test, to assess the odor that results when a sample of your vaginal discharge is mixed with a potassium hydroxide solution.  Tests of vaginal fluid. A sample will be examined under a microscope.  How is this treated? Treatment varies depending on the type of vaginitis you have. Your treatment may include:  Antibiotic creams or pills to treat bacterial vaginosis and trichomoniasis.  Antifungal medicines, such as vaginal creams  or suppositories, to treat a yeast infection.  Medicine to ease discomfort if you have viral vaginitis. Your sexual partner should also be treated.  Estrogen delivered in a cream, pill, suppository, or vaginal ring to treat atrophic vaginitis. If vaginal dryness occurs, lubricants and moisturizing creams may help. You may need to avoid scented soaps, sprays, or douches.  Stopping use of a product that is causing allergic vaginitis. Then using a vaginal cream to treat the symptoms.  Follow these  instructions at home: Lifestyle  Keep your genital area clean and dry. Avoid soap, and only rinse the area with water.  Do not douche or use tampons until your health care provider says it is okay to do so. Use sanitary pads, if needed.  Do not have sex until your health care provider approves. When you can return to sex, practice safe sex and use condoms.  Wipe from front to back. This avoids the spread of bacteria from the rectum to the vagina. General instructions  Take over-the-counter and prescription medicines only as told by your health care provider.  If you were prescribed an antibiotic medicine, take or use it as told by your health care provider. Do not stop taking or using the antibiotic even if you start to feel better.  Keep all follow-up visits as told by your health care provider. This is important. How is this prevented?  Use mild, non-scented products. Do not use things that can irritate the vagina, such as fabric softeners. Avoid the following products if they are scented: ? Feminine sprays. ? Detergents. ? Tampons. ? Feminine hygiene products. ? Soaps or bubble baths.  Let air reach your genital area. ? Wear cotton underwear to reduce moisture buildup. ? Avoid wearing underwear while you sleep. ? Avoid wearing tight pants and underwear or nylons without a cotton panel. ? Avoid wearing thong underwear.  Take off any wet clothing, such as bathing suits, as soon as possible.  Practice safe sex and use condoms. Contact a health care provider if:  You have abdominal pain.  You have a fever.  You have symptoms that last for more than 2-3 days. Get help right away if:  You have a fever and your symptoms suddenly get worse. Summary  Vaginitis is a condition in which the vaginal tissue becomes inflamed.This condition is most often caused by a change in the normal balance of bacteria and yeast that live in the vagina.  Treatment varies depending on the type  of vaginitis you have.  Do not douche, use tampons , or have sex until your health care provider approves. When you can return to sex, practice safe sex and use condoms. This information is not intended to replace advice given to you by your health care provider. Make sure you discuss any questions you have with your health care provider. Document Released: 05/19/2007 Document Revised: 08/27/2016 Document Reviewed: 08/27/2016 Elsevier Interactive Patient Education  2018 [place] [employer] (AHA) Exercise Recommendation  Being physically active is important to prevent heart disease and stroke, the nation's No. 1and No. 5killers. To improve overall cardiovascular health, we suggest at least 150 minutes per week of moderate exercise or 75 minutes per week of vigorous exercise (or a combination of moderate and vigorous activity). Thirty minutes a day, five times a week is an easy goal to remember. You will also experience benefits even if you divide your time into two or three segments of 10 to 15 minutes per day.  For people  who would benefit from lowering their blood pressure or cholesterol, we recommend 40 minutes of aerobic exercise of moderate to vigorous intensity three to four times a week to lower the risk for heart attack and stroke.  Physical activity is anything that makes you move your body and burn calories.  This includes things like climbing stairs or playing sports. Aerobic exercises benefit your heart, and include walking, jogging, swimming or biking. Strength and stretching exercises are best for overall stamina and flexibility.  The simplest, positive change you can make to effectively improve your heart health is to start walking. It's enjoyable, free, easy, social and great exercise. A walking program is flexible and boasts high success rates because people can stick with it. It's easy for walking to become a regular and satisfying part of life.   For Overall  Cardiovascular Health:  At least 30 minutes of moderate-intensity aerobic activity at least 5 days per week for a total of 150  OR   At least 25 minutes of vigorous aerobic activity at least 3 days per week for a total of 75 minutes; or a combination of moderate- and vigorous-intensity aerobic activity  AND   Moderate- to high-intensity muscle-strengthening activity at least 2 days per week for additional health benefits.  For Lowering Blood Pressure and Cholesterol  An average 40 minutes of moderate- to vigorous-intensity aerobic activity 3 or 4 times per week  What if I can't make it to the time goal? Something is always better than nothing! And everyone has to start somewhere. Even if you've been sedentary for years, today is the day you can begin to make healthy changes in your life. If you don't think you'll make it for 30 or 40 minutes, set a reachable goal for today. You can work up toward your overall goal by increasing your time as you get stronger. Don't let all-or-nothing thinking rob you of doing what you can every day.  Source:http://www.heart.org    Mediterranean Diet A Mediterranean diet refers to food and lifestyle choices that are based on the traditions of countries located on the [employer]. This way of eating has been shown to help prevent certain conditions and improve outcomes for people who have chronic diseases, like kidney disease and heart disease. What are tips for following this plan? Lifestyle  Cook and eat meals together with your family, when possible.  Drink enough fluid to keep your urine clear or pale yellow.  Be physically active every day. This includes: ? Aerobic exercise like running or swimming. ? Leisure activities like gardening, walking, or housework.  Get 7-8 hours of sleep each night.  If recommended by your health care provider, drink red wine in moderation. This means 1 glass a day for nonpregnant women and 2 glasses a day  for men. A glass of wine equals 5 oz (150 mL). Reading food labels  Check the serving size of packaged foods. For foods such as rice and pasta, the serving size refers to the amount of cooked product, not dry.  Check the total fat in packaged foods. Avoid foods that have saturated fat or trans fats.  Check the ingredients list for added sugars, such as corn syrup. Shopping  At the grocery store, buy most of your food from the areas near the walls of the store. This includes: ? Fresh fruits and vegetables (produce). ? Grains, beans, nuts, and seeds. Some of these may be available in unpackaged forms or large amounts (in bulk). ? Fresh seafood. ?  Poultry and eggs. ? Low-fat dairy products.  Buy whole ingredients instead of prepackaged foods.  Buy fresh fruits and vegetables in-season from local farmers markets.  Buy frozen fruits and vegetables in resealable bags.  If you do not have access to quality fresh seafood, buy precooked frozen shrimp or canned fish, such as tuna, salmon, or sardines.  Buy small amounts of raw or cooked vegetables, salads, or olives from the deli or salad bar at your store.  Stock your pantry so you always have certain foods on hand, such as olive oil, canned tuna, canned tomatoes, rice, pasta, and beans. Cooking  Cook foods with extra-virgin olive oil instead of using butter or other vegetable oils.  Have meat as a side dish, and have vegetables or grains as your main dish. This means having meat in small portions or adding small amounts of meat to foods like pasta or stew.  Use beans or vegetables instead of meat in common dishes like chili or lasagna.  Experiment with different cooking methods. Try roasting or broiling vegetables instead of steaming or sauteing them.  Add frozen vegetables to soups, stews, pasta, or rice.  Add nuts or seeds for added healthy fat at each meal. You can add these to yogurt, salads, or vegetable dishes.  Marinate fish  or vegetables using olive oil, lemon juice, garlic, and fresh herbs. Meal planning  Plan to eat 1 vegetarian meal one day each week. Try to work up to 2 vegetarian meals, if possible.  Eat seafood 2 or more times a week.  Have healthy snacks readily available, such as: ? Vegetable sticks with hummus. ? [place] yogurt. ? Fruit and nut trail mix.  Eat balanced meals throughout the week. This includes: ? Fruit: 2-3 servings a day ? Vegetables: 4-5 servings a day ? Low-fat dairy: 2 servings a day ? Fish, poultry, or lean meat: 1 serving a day ? Beans and legumes: 2 or more servings a week ? Nuts and seeds: 1-2 servings a day ? Whole grains: 6-8 servings a day ? Extra-virgin olive oil: 3-4 servings a day  Limit red meat and sweets to only a few servings a month What are my food choices?  Mediterranean diet ? Recommended ? Grains: Whole-grain pasta. Brown rice. Bulgar wheat. Polenta. Couscous. Whole-wheat bread. [place]. ? Vegetables: Artichokes. Beets. Broccoli. Cabbage. Carrots. Eggplant. Green beans. Chard. Kale. Spinach. Onions. Leeks. Peas. Squash. Tomatoes. Peppers. Radishes. ? Fruits: Apples. Apricots. Avocado. Berries. Bananas. Cherries. Dates. Figs. Grapes. Lemons. Melon. Oranges. Peaches. Plums. Pomegranate. ? Meats and other protein foods: Beans. Almonds. Sunflower seeds. Pine nuts. Peanuts. [place]. Salmon. Scallops. Shrimp. [place]. Tilapia. Clams. Oysters. Eggs. ? Dairy: Low-fat milk. Cheese. Greek yogurt. ? Beverages: Water. Red wine. Herbal tea. ? Fats and oils: Extra virgin olive oil. Avocado oil. Grape seed oil. ? Sweets and desserts: [place] yogurt with honey. Baked apples. Poached pears. Trail mix. ? Seasoning and other foods: Basil. Cilantro. Coriander. Cumin. Mint. Parsley. Sage. Rosemary. Tarragon. Garlic. Oregano. Thyme. Pepper. Balsalmic vinegar. Tahini. Hummus. Tomato sauce. Olives. Mushrooms. ? Limit these ? Grains: Prepackaged pasta or rice dishes. Prepackaged  cereal with added sugar. ? Vegetables: Deep fried potatoes (french fries). ? Fruits: Fruit canned in syrup. ? Meats and other protein foods: Beef. Pork. Lamb. Poultry with skin. Hot dogs. [place] Salines. ? Dairy: Ice cream. Sour cream. Whole milk. ? Beverages: Juice. Sugar-sweetened soft drinks. Beer. Liquor and spirits. ? Fats and oils: Butter. Canola oil. Vegetable oil. Beef fat (tallow). Lard. ? Sweets and  desserts: Cookies. Cakes. Pies. Candy. ? Seasoning and other foods: Mayonnaise. Premade sauces and marinades. ? The items listed may not be a complete list. Talk with your dietitian about what dietary choices are right for you. Summary  The Mediterranean diet includes both food and lifestyle choices.  Eat a variety of fresh fruits and vegetables, beans, nuts, seeds, and whole grains.  Limit the amount of red meat and sweets that you eat.  Talk with your health care provider about whether it is safe for you to drink red wine in moderation. This means 1 glass a day for nonpregnant women and 2 glasses a day for men. A glass of wine equals 5 oz (150 mL). This information is not intended to replace advice given to you by your health care provider. Make sure you discuss any questions you have with your health care provider. Document Released: 03/14/2016 Document Revised: 04/16/2016 Document Reviewed: 03/14/2016 Elsevier Interactive Patient Education  [employer].

## 2018-05-27 NOTE — Progress Notes (Signed)
Patient ID: Holly Cox, female   DOB: 02/28/1986, 32 y.o.   MRN: [id number]     Gynecology Annual Exam   PCP: Patient, No Pcp Per  Chief Complaint:  Chief Complaint  Patient presents with  . Gynecologic Exam    heavy painful periods  . anxiety/depression    PHQ-9/GAD-7 given    History of Present Illness: Patient is a 32 y.o. [id number] presents for annual exam. The patient has complaints today of pain on right ovary that just started while awaiting today's exam. She has a history of PCOS with alternating pain between right and left ovary. She has had irregular, occasional stabbing chest pain in her left chest for the past 4 years. She does not have current chest pain. She has a family history of heart attack in mother. She requests referral to cardiology. She has complaint of ongoing yeast symptoms in the past month. She was treated for an infection with augmentin and then took diflucan. She also needed clindamycin followed by another dose of diflucan. She is still feeling some vaginal, especially external, itching. She has noticed a small amount of discharge.   LMP: Patient's last menstrual period was 05/19/2018. Average Interval: regular, 21 days Duration of flow: 3 days Heavy Menses: yes Clots: no Intermenstrual Bleeding: no Postcoital Bleeding: no Dysmenorrhea: yes  The patient is sexually active. She currently uses tubal ligation for contraception. She denies dyspareunia.  The patient does perform self breast exams.  There is no notable family history of breast or ovarian cancer in her family.  The patient wears seatbelts: yes.   The patient has regular exercise: she walks. She admits decreased appetite and usually only eats dinner. She admits adequate hydration. She denies adequate sleep.   The patient reports current symptoms of situational depression and increased anxiety. She has had increased stress in the past couple of months with the death of a friend and  then of a family member. Discussed the importance of healthy lifestyle, coping techniques, medication if needed and counseling. She is interested in finding a therapist.  She continues to take .25 mg xanax at the time of her periods to help her cope and says it is helping her.   Review of Systems: Review of Systems  Constitutional: Positive for malaise/fatigue.       Cold intolerance  HENT: Negative.   Eyes: Negative.   Respiratory: Negative.   Cardiovascular: Positive for chest pain.  Gastrointestinal: Negative.   Genitourinary:       Discharge, itching  Musculoskeletal: Negative.   Skin: Negative.   Neurological: Negative.   Endo/Heme/Allergies: Positive for environmental allergies.  Psychiatric/Behavioral:       Anxiety, depression    Past Medical History:  Past Medical History:  Diagnosis Date  . BRCA negative 05/2017   MyRisk neg  . Family history of ovarian cancer    10/18 MyRisk neg  . Gestational diabetes   . IBS (irritable bowel syndrome)   . Motion sickness    Rare  . Seasonal allergies   . Thrombocytopenia ([place])    while pregnant    Past Surgical History:  Past Surgical History:  Procedure Laterality Date  . ADENOIDECTOMY  2006  . COLONOSCOPY N/A 12/23/2014   Procedure: COLONOSCOPY;  Surgeon: Lucilla Lame, MD;  Location: [facility];  Service: Gastroenterology;  Laterality: N/A;  . TONSILLECTOMY  2006  . TUBAL LIGATION Bilateral 2013  . WISDOM TOOTH EXTRACTION  2005    Gynecologic History:  Patient's last  menstrual period was 05/19/2018. Contraception: tubal ligation Last Pap: Results were: no abnormalities   Obstetric History: G2P0202  Family History:  Family History  Problem Relation Age of Onset  . Heart attack Mother   . Colon cancer Mother 44  . Diabetes Mellitus II Paternal Grandmother   . Ovarian cancer Paternal Grandmother     Social History:  Social History   Socioeconomic History  . Marital status: Married    Spouse name:  Not on file  . Number of children: Not on file  . Years of education: Not on file  . Highest education level: Not on file  Occupational History  . Not on file  Social Needs  . Financial resource strain: Not on file  . Food insecurity:    Worry: Not on file    Inability: Not on file  . Transportation needs:    Medical: Not on file    Non-medical: Not on file  Tobacco Use  . Smoking status: Former Smoker    Last attempt to quit: 08/05/2006    Years since quitting: 11.8  . Smokeless tobacco: Never Used  Substance and Sexual Activity  . Alcohol use: Yes    Alcohol/week: 5.0 standard drinks    Types: 5 Cans of beer per week    Comment: Occ  . Drug use: Never  . Sexual activity: Yes    Birth control/protection: Surgical  Lifestyle  . Physical activity:    Days per week: Not on file    Minutes per session: Not on file  . Stress: Not on file  Relationships  . Social connections:    Talks on phone: Not on file    Gets together: Not on file    Attends religious service: Not on file    Active member of club or organization: Not on file    Attends meetings of clubs or organizations: Not on file    Relationship status: Not on file  . Intimate partner violence:    Fear of current or ex partner: Not on file    Emotionally abused: Not on file    Physically abused: Not on file    Forced sexual activity: Not on file  Other Topics Concern  . Not on file  Social History Narrative   Works at: [employer] in [place]:  Allergies  Allergen Reactions  . Ceclor [Cefaclor] Swelling  . Neosporin [Neomycin-Bacitracin Zn-Polymyx]     Medications: Prior to Admission medications   Medication Sig Start Date End Date Taking? Authorizing Provider  ALPRAZolam Duanne Moron) 0.25 MG tablet TAKE 1 TABLET BY MOUTH 3 TIMES A DAY DURING MENSTRUAL CYCLE 03/12/18  Yes Rod Can, CNM  fluconazole (DIFLUCAN) 150 MG tablet TAKE 1 TABLET BY MOUTH ONCE 05/19/18   [provider]    Physical Exam Vitals: Blood pressure 120/78, pulse 94, height _0  (1.626 m), weight 136 lb (61.7 kg), last menstrual period 05/19/2018.  General: NAD HEENT: normocephalic, anicteric Thyroid: no enlargement, no palpable nodules Pulmonary: No increased work of breathing, CTAB Cardiovascular: RRR, distal pulses 2+ Breast: Breast symmetrical, no tenderness, no palpable nodules or masses, no skin or nipple retraction present, no nipple discharge.  No axillary or supraclavicular lymphadenopathy. Abdomen: NABS, soft, non-tender, non-distended.  Umbilicus without lesions.  No hepatomegaly, splenomegaly or masses palpable. No evidence of hernia  Genitourinary:  External: Normal external female genitalia.  Normal urethral meatus, normal Bartholin's and Skene's glands.    Vagina: Normal vaginal mucosa, no evidence  of prolapse, thick white discharge.    Cervix: Grossly normal in appearance, no bleeding, no CMT  Uterus: Non-enlarged, mobile, normal contour.    Adnexa: ovaries non-enlarged, no adnexal masses, slightly tender to palpation  Rectal: deferred  Lymphatic: no evidence of inguinal lymphadenopathy Extremities: no edema, erythema, or tenderness Neurologic: Grossly intact Psychiatric: mood appropriate, affect full  PHQ-9 score 14  mainly sleep issues, feeling tired, poor appetite GAD-7 score 12  Mainly worry, irritability, restlessness, anxious  Wet Prep: negative for yeast, whiff. A couple of clue cells seen   Assessment: 32 y.o. G2P0202 routine annual exam  Plan: Problem List Items Addressed This Visit    None    Visit Diagnoses    Well woman exam with routine gynecological exam    -  Primary   Relevant Orders   Cervicovaginal ancillary only   Screen for sexually transmitted diseases       Relevant Orders   Cervicovaginal ancillary only   Vaginal itching       Chest pain in adult       Relevant Orders   Ambulatory referral to Cardiology      1) STI screening  was  offered and accepted  2)  ASCCP guidelines and rational discussed.  Patient opts for every 3 years screening interval  3) Contraception - the patient is currently using  tubal ligation.  She is happy with her current form of contraception and plans to continue  4) Routine healthcare maintenance including cholesterol, diabetes screening discussed Declines   5) Referral to cardiology for chest pain  6) Recommend counseling with mental health specialist  7) Will wait for results of aptima before prescribing vaginitis medication  8) Return in 1 year (on 05/28/2019) for annual established gyn.   Rod Can, [facility], [facility] 05/27/2018, 4:58 PM

## 2018-05-29 LAB — CERVICOVAGINAL ANCILLARY ONLY
Bacterial vaginitis: NEGATIVE
Candida vaginitis: NEGATIVE
Chlamydia: NEGATIVE
NEISSERIA GONORRHEA: NEGATIVE
Trichomonas: NEGATIVE

## 2018-07-24 ENCOUNTER — Other Ambulatory Visit: Payer: Self-pay | Admitting: Adult Health

## 2018-07-24 ENCOUNTER — Ambulatory Visit (INDEPENDENT_AMBULATORY_CARE_PROVIDER_SITE_OTHER): Payer: BLUE CROSS/BLUE SHIELD | Admitting: Adult Health

## 2018-07-24 ENCOUNTER — Encounter: Payer: Self-pay | Admitting: Adult Health

## 2018-07-24 ENCOUNTER — Ambulatory Visit: Payer: Self-pay | Admitting: Adult Health

## 2018-07-24 VITALS — BP 110/70 | HR 68 | Resp 16 | Ht 64.0 in | Wt 140.0 lb

## 2018-07-24 DIAGNOSIS — R002 Palpitations: Secondary | ICD-10-CM | POA: Diagnosis not present

## 2018-07-24 DIAGNOSIS — R079 Chest pain, unspecified: Secondary | ICD-10-CM

## 2018-07-24 DIAGNOSIS — F1510 Other stimulant abuse, uncomplicated: Secondary | ICD-10-CM

## 2018-07-24 NOTE — Patient Instructions (Signed)
Palpitations  Palpitations are feelings that your heartbeat is not normal. Your heartbeat may feel like it is:   Uneven.   Faster than normal.   Fluttering.   Skipping a beat.  This is usually not a serious problem. In some cases, you may need tests to rule out any serious problems.  Follow these instructions at home:  Pay attention to any changes in your condition. Take these actions to help manage your symptoms:  Eating and drinking   Avoid:  ? Coffee, tea, soft drinks, and energy drinks.  ? Chocolate.  ? Alcohol.  ? Diet pills.  Lifestyle     Try to lower your stress. These things can help you relax:  ? Yoga.  ? Deep breathing and meditation.  ? Exercise.  ? Using words and images to create positive thoughts (guided imagery).  ? Using your mind to control things in your body (biofeedback).   Do not use drugs.   Get plenty of rest and sleep. Keep a regular bed time.  General instructions     Take over-the-counter and prescription medicines only as told by your doctor.   Do not use any products that contain nicotine or tobacco, such as cigarettes and e-cigarettes. If you need help quitting, ask your doctor.   Keep all follow-up visits as told by your doctor. This is important. You may need more tests if palpitations do not go away or get worse.  Contact a doctor if:   Your symptoms last more than 24 hours.   Your symptoms occur more often.  Get help right away if you:   Have chest pain.   Feel short of breath.   Have a very bad headache.   Feel dizzy.   Pass out (faint).  Summary   Palpitations are feelings that your heartbeat is uneven or faster than normal. It may feel like your heart is fluttering or skipping a beat.   Avoid food and drinks that may cause palpitations. These include caffeine, chocolate, and alcohol.   Try to lower your stress. Do not smoke or use drugs.   Get help right away if you faint or have chest pain, shortness of breath, a severe headache, or dizziness.  This  information is not intended to replace advice given to you by your health care provider. Make sure you discuss any questions you have with your health care provider.  Document Released: 04/30/2008 Document Revised: 09/03/2017 Document Reviewed: 09/03/2017  Elsevier Interactive Patient Education  2019 Elsevier Inc[employer]

## 2018-07-24 NOTE — Progress Notes (Signed)
[facility] [place], [place] [address]  Internal MEDICINE  Office Visit Note  Patient Name: Holly Cox  [id number]  [id number]  Date of Service: 07/28/2018   Complaints/HPI Pt is here for establishment of PCP. Chief Complaint  Patient presents with  . New Patient (Initial Visit)  . Chest Pain  . Numbness  . Shoulder Pain   HPI Patient is here to establish care.  She is well-appearing 32 year old Caucasian female.  Generally she is doing well however she reports approximately 3 months worth of intermittent chest tightness with palpitations.  She reports at times the pain goes down her left shoulder and up into her neck.  She has been to the last week they have become more frequent.  She does not have a history of cardiac issues.  She reports that she is a moderate caffeine consumer.  She reports drinking a large cup of coffee each morning and on some days drinking monster energy drinks.  She is mostly concerned because her family history includes a mother and grandmother who both had MIs early.  Both of them were in their early 16s.  She is a non-smoker.  She does not consume any alcohol or do any street drugs.  She currently works as an Systems developer and states that her job is moderately stressful and that may have some effect on this.   HX PCOS Gestational DM, last a1c5.0,  Depression, not currently treated.   Current Medication: Outpatient Encounter Medications as of 07/24/2018  Medication Sig  . ALPRAZolam (XANAX) 0.25 MG tablet TAKE 1 TABLET BY MOUTH 3 TIMES A DAY DURING MENSTRUAL CYCLE  . fluconazole (DIFLUCAN) 150 MG tablet TAKE 1 TABLET BY MOUTH ONCE   No facility-administered encounter medications on file as of 07/24/2018.     Surgical History: Past Surgical History:  Procedure Laterality Date  . ADENOIDECTOMY  2006  . COLONOSCOPY N/A 12/23/2014   Procedure: COLONOSCOPY;  Surgeon: Lucilla Lame, MD;  Location: [facility];  Service: Gastroenterology;  Laterality: N/A;  . TONSILLECTOMY  2006  . TUBAL LIGATION Bilateral 2013  . WISDOM TOOTH EXTRACTION  2005    Medical History: Past Medical History:  Diagnosis Date  . BRCA negative 05/2017   MyRisk neg  . Family history of ovarian cancer    10/18 MyRisk neg  . Gestational diabetes   . IBS (irritable bowel syndrome)   . Motion sickness    Rare  . Seasonal allergies   . Thrombocytopenia ([place])    while pregnant    Family History: Family History  Problem Relation Age of Onset  . Heart attack Mother   . Colon cancer Mother 1  . Diabetes Mellitus II Paternal Grandmother   . Ovarian cancer Paternal Grandmother     Social History   Socioeconomic History  . Marital status: Married    Spouse name: Not on file  . Number of children: Not on file  . Years of education: Not on file  . Highest education level: Not on file  Occupational History  . Not on file  Social Needs  . Financial resource strain: Not on file  . Food insecurity:    Worry: Not on file    Inability: Not on file  . Transportation needs:    Medical: Not on file    Non-medical: Not on file  Tobacco Use  . Smoking status: Former Smoker    Last attempt to quit: 08/05/2006    Years  since quitting: 11.9  . Smokeless tobacco: Never Used  Substance and Sexual Activity  . Alcohol use: Yes    Alcohol/week: 5.0 standard drinks    Types: 5 Cans of beer per week    Comment: Occ  . Drug use: Never  . Sexual activity: Yes    Birth control/protection: Surgical  Lifestyle  . Physical activity:    Days per week: Not on file    Minutes per session: Not on file  . Stress: Not on file  Relationships  . Social connections:    Talks on phone: Not on file    Gets together: Not on file    Attends religious service: Not on file    Active member of club or organization: Not on file    Attends meetings of clubs or organizations: Not on file    Relationship status: Not on file   . Intimate partner violence:    Fear of current or ex partner: Not on file    Emotionally abused: Not on file    Physically abused: Not on file    Forced sexual activity: Not on file  Other Topics Concern  . Not on file  Social History Narrative   Works at: [employer] in [place]  Constitutional: Negative for chills, fatigue and unexpected weight change.  HENT: Negative for congestion, rhinorrhea, sneezing and sore throat.   Eyes: Negative for photophobia, pain and redness.  Respiratory: Negative for cough, chest tightness and shortness of breath.   Cardiovascular: Negative for chest pain and palpitations.  Gastrointestinal: Negative for abdominal pain, constipation, diarrhea, nausea and vomiting.  Endocrine: Negative.   Genitourinary: Negative for dysuria and frequency.  Musculoskeletal: Negative for arthralgias, back pain, joint swelling and neck pain.  Skin: Negative for rash.  Allergic/Immunologic: Negative.   Neurological: Negative for tremors and numbness.  Hematological: Negative for adenopathy. Does not bruise/bleed easily.  Psychiatric/Behavioral: Negative for behavioral problems and sleep disturbance. The patient is not nervous/anxious.     Vital Signs: BP 110/70   Pulse 68   Resp 16   Ht 5' 4"  (1.626 m)   Wt 140 lb (63.5 kg)   SpO2 98%   BMI 24.03 kg/m    Physical Exam Vitals signs and nursing note reviewed.  Constitutional:      General: She is not in acute distress.    Appearance: She is well-developed. She is not diaphoretic.  HENT:     Head: Normocephalic and atraumatic.     Mouth/Throat:     Pharynx: No oropharyngeal exudate.  Eyes:     Pupils: Pupils are equal, round, and reactive to light.  Neck:     Musculoskeletal: Normal range of motion and neck supple.     Thyroid: No thyromegaly.     Vascular: No JVD.     Trachea: No tracheal deviation.  Cardiovascular:     Rate and Rhythm: Normal rate and regular  rhythm.     Heart sounds: Normal heart sounds. No murmur. No friction rub. No gallop.   Pulmonary:     Effort: Pulmonary effort is normal. No respiratory distress.     Breath sounds: Normal breath sounds. No wheezing or rales.  Chest:     Chest wall: No tenderness.  Abdominal:     Palpations: Abdomen is soft.     Tenderness: There is no abdominal tenderness. There is no guarding.  Musculoskeletal: Normal range of motion.  Lymphadenopathy:     Cervical:  No cervical adenopathy.  Skin:    General: Skin is warm and dry.  Neurological:     Mental Status: She is alert and oriented to person, place, and time.     Cranial Nerves: No cranial nerve deficit.  Psychiatric:        Behavior: Behavior normal.        Thought Content: Thought content normal.        Judgment: Judgment normal.    Assessment/Plan: 1. Palpitations Patient will return to office on Tuesday to have Holter monitor installed which she will wear for 48 hours.  An echocardiogram has been ordered and patient will receive that at this time.  I have encouraged patient to take over-the-counter magnesium and calcium in the interim while we are waiting for test results.  I have also instructed her that if she gets a chest pain that does not resolve quickly or any new symptoms develop such as shortness of breath dizziness or increased palpitations she is to go directly to the emergency department.  EKG in our office is a 9 at this time.   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  She may have 1 cup of coffee in the mornings if she absolutely has to however I would prefer that she cut completely her caffeine until diagnosed can be made.  3. Chest pain, unspecified type Patient will have the following studies and will follow-up in 3 weeks. - EKG 12-Lead - ECHOCARDIOGRAM COMPLETE; Future  General Counseling: Velma Agnes verbalizes understanding of the findings of todays  visit and agrees with plan of treatment. I have discussed any further diagnostic evaluation that may be needed or ordered today. We also reviewed her medications today. she has been encouraged to call the office with any questions or concerns that should arise related to todays visit.  Orders Placed This Encounter  Procedures  . EKG 12-Lead  . ECHOCARDIOGRAM COMPLETE    No orders of the defined types were placed in this encounter.   Time spent: 30 Minutes   This patient was seen by Orson Gear AGNP-C in Collaboration with Dr Lavera Guise as a part of collaborative care agreement  Kendell Bane AGNP-C Internal Medicine

## 2018-07-25 LAB — CBC WITH DIFFERENTIAL/PLATELET
BASOS ABS: 0.1 10*3/uL (ref 0.0–0.2)
Basos: 2 %
EOS (ABSOLUTE): 0.2 10*3/uL (ref 0.0–0.4)
Eos: 3 %
Hematocrit: 39.3 % (ref 34.0–46.6)
Hemoglobin: 13.8 g/dL (ref 11.1–15.9)
Immature Grans (Abs): 0 10*3/uL (ref 0.0–0.1)
Immature Granulocytes: 0 %
LYMPHS ABS: 1.5 10*3/uL (ref 0.7–3.1)
Lymphs: 31 %
MCH: 31.7 pg (ref 26.6–33.0)
MCHC: 35.1 g/dL (ref 31.5–35.7)
MCV: 90 fL (ref 79–97)
Monocytes Absolute: 0.4 10*3/uL (ref 0.1–0.9)
Monocytes: 8 %
Neutrophils Absolute: 2.7 10*3/uL (ref 1.4–7.0)
Neutrophils: 56 %
Platelets: 193 10*3/uL (ref 150–450)
RBC: 4.35 x10E6/uL (ref 3.77–5.28)
RDW: 12.1 % — ABNORMAL LOW (ref 12.3–15.4)
WBC: 4.8 10*3/uL (ref 3.4–10.8)

## 2018-07-25 LAB — IRON AND TIBC
Iron Saturation: 25 % (ref 15–55)
Iron: 87 ug/dL (ref 27–159)
TIBC: 354 ug/dL (ref 250–450)
UIBC: 267 ug/dL (ref 131–425)

## 2018-07-25 LAB — T4, FREE: Free T4: 1.16 ng/dL (ref 0.82–1.77)

## 2018-07-25 LAB — TSH: TSH: 1.57 u[IU]/mL (ref 0.450–4.500)

## 2018-07-25 LAB — FERRITIN: Ferritin: 64 ng/mL (ref 15–150)

## 2018-07-28 ENCOUNTER — Ambulatory Visit (INDEPENDENT_AMBULATORY_CARE_PROVIDER_SITE_OTHER): Payer: BLUE CROSS/BLUE SHIELD

## 2018-07-28 DIAGNOSIS — R079 Chest pain, unspecified: Secondary | ICD-10-CM | POA: Diagnosis not present

## 2018-07-31 ENCOUNTER — Other Ambulatory Visit: Payer: Self-pay

## 2018-07-31 NOTE — Progress Notes (Signed)
holter hookup for 48 hrs

## 2018-08-10 ENCOUNTER — Encounter

## 2018-08-10 ENCOUNTER — Ambulatory Visit: Payer: BLUE CROSS/BLUE SHIELD | Admitting: Cardiovascular Disease

## 2018-08-11 ENCOUNTER — Ambulatory Visit: Payer: Self-pay | Admitting: Internal Medicine

## 2019-06-02 ENCOUNTER — Encounter: Payer: Self-pay | Admitting: Advanced Practice Midwife

## 2019-06-02 ENCOUNTER — Other Ambulatory Visit (HOSPITAL_COMMUNITY)
Admission: RE | Admit: 2019-06-02 | Discharge: 2019-06-02 | Disposition: A | Discharge status: Home / Self Care | Payer: BC Managed Care – PPO | Source: Ambulatory Visit | Attending: Advanced Practice Midwife | Admitting: Advanced Practice Midwife

## 2019-06-02 ENCOUNTER — Ambulatory Visit (INDEPENDENT_AMBULATORY_CARE_PROVIDER_SITE_OTHER): Payer: BC Managed Care – PPO | Admitting: Advanced Practice Midwife

## 2019-06-02 ENCOUNTER — Other Ambulatory Visit: Payer: Self-pay

## 2019-06-02 VITALS — BP 122/74 | Ht 64.0 in | Wt 144.0 lb

## 2019-06-02 DIAGNOSIS — Z124 Encounter for screening for malignant neoplasm of cervix: Secondary | ICD-10-CM | POA: Diagnosis not present

## 2019-06-02 DIAGNOSIS — Z01419 Encounter for gynecological examination (general) (routine) without abnormal findings: Secondary | ICD-10-CM | POA: Diagnosis not present

## 2019-06-02 DIAGNOSIS — Z113 Encounter for screening for infections with a predominantly sexual mode of transmission: Secondary | ICD-10-CM | POA: Diagnosis not present

## 2019-06-02 DIAGNOSIS — N9489 Other specified conditions associated with female genital organs and menstrual cycle: Secondary | ICD-10-CM

## 2019-06-02 NOTE — Patient Instructions (Signed)
Health Maintenance, Female Adopting a healthy lifestyle and getting preventive care are important in promoting health and wellness. Ask your health care provider about:  The right schedule for you to have regular tests and exams.  Things you can do on your own to prevent diseases and keep yourself healthy. What should I know about diet, weight, and exercise? Eat a healthy diet   Eat a diet that includes plenty of vegetables, fruits, low-fat dairy products, and lean protein.  Do not eat a lot of foods that are high in solid fats, added sugars, or sodium. Maintain a healthy weight Body mass index (BMI) is used to identify weight problems. It estimates body fat based on height and weight. Your health care provider can help determine your BMI and help you achieve or maintain a healthy weight. Get regular exercise Get regular exercise. This is one of the most important things you can do for your health. Most adults should:  Exercise for at least 150 minutes each week. The exercise should increase your heart rate and make you sweat (moderate-intensity exercise).  Do strengthening exercises at least twice a week. This is in addition to the moderate-intensity exercise.  Spend less time sitting. Even light physical activity can be beneficial. Watch cholesterol and blood lipids Have your blood tested for lipids and cholesterol at 31 years of age, then have this test every 5 years. Have your cholesterol levels checked more often if:  Your lipid or cholesterol levels are high.  You are older than 31 years of age.  You are at high risk for heart disease. What should I know about cancer screening? Depending on your health history and family history, you may need to have cancer screening at various ages. This may include screening for:  Breast cancer.  Cervical cancer.  Colorectal cancer.  Skin cancer.  Lung cancer. What should I know about heart disease, diabetes, and high blood  pressure? Blood pressure and heart disease  High blood pressure causes heart disease and increases the risk of stroke. This is more likely to develop in people who have high blood pressure readings, are of African descent, or are overweight.  Have your blood pressure checked: ? Every 3-5 years if you are 18-39 years of age. ? Every year if you are 40 years old or older. Diabetes Have regular diabetes screenings. This checks your fasting blood sugar level. Have the screening done:  Once every three years after age 40 if you are at a normal weight and have a low risk for diabetes.  More often and at a younger age if you are overweight or have a high risk for diabetes. What should I know about preventing infection? Hepatitis B If you have a higher risk for hepatitis B, you should be screened for this virus. Talk with your health care provider to find out if you are at risk for hepatitis B infection. Hepatitis C Testing is recommended for:  Everyone born from 1945 through 1965.  Anyone with known risk factors for hepatitis C. Sexually transmitted infections (STIs)  Get screened for STIs, including gonorrhea and chlamydia, if: ? You are sexually active and are younger than 31 years of age. ? You are older than 31 years of age and your health care provider tells you that you are at risk for this type of infection. ? Your sexual activity has changed since you were last screened, and you are at increased risk for chlamydia or gonorrhea. Ask your health care provider if   you are at risk.  Ask your health care provider about whether you are at high risk for HIV. Your health care provider may recommend a prescription medicine to help prevent HIV infection. If you choose to take medicine to prevent HIV, you should first get tested for HIV. You should then be tested every 3 months for as long as you are taking the medicine. Pregnancy  If you are about to stop having your period (premenopausal) and  you may become pregnant, seek counseling before you get pregnant.  Take 400 to 800 micrograms (mcg) of folic acid every day if you become pregnant.  Ask for birth control (contraception) if you want to prevent pregnancy. Osteoporosis and menopause Osteoporosis is a disease in which the bones lose minerals and strength with aging. This can result in bone fractures. If you are 65 years old or older, or if you are at risk for osteoporosis and fractures, ask your health care provider if you should:  Be screened for bone loss.  Take a calcium or vitamin D supplement to lower your risk of fractures.  Be given hormone replacement therapy (HRT) to treat symptoms of menopause. Follow these instructions at home: Lifestyle  Do not use any products that contain nicotine or tobacco, such as cigarettes, e-cigarettes, and chewing tobacco. If you need help quitting, ask your health care provider.  Do not use street drugs.  Do not share needles.  Ask your health care provider for help if you need support or information about quitting drugs. Alcohol use  Do not drink alcohol if: ? Your health care provider tells you not to drink. ? You are pregnant, may be pregnant, or are planning to become pregnant.  If you drink alcohol: ? Limit how much you use to 0-1 drink a day. ? Limit intake if you are breastfeeding.  Be aware of how much alcohol is in your drink. In the [place] one drink equals one 12 oz bottle of beer (355 mL), one 5 oz glass of wine (148 mL), or one 1 oz glass of hard liquor (44 mL). General instructions  Schedule regular health, dental, and eye exams.  Stay current with your vaccines.  Tell your health care provider if: ? You often feel depressed. ? You have ever been abused or do not feel safe at home. Summary  Adopting a healthy lifestyle and getting preventive care are important in promoting health and wellness.  Follow your health care provider's instructions about healthy  diet, exercising, and getting tested or screened for diseases.  Follow your health care provider's instructions on monitoring your cholesterol and blood pressure. This information is not intended to replace advice given to you by your health care provider. Make sure you discuss any questions you have with your health care provider. Document Released: 02/04/2011 Document Revised: 07/15/2018 Document Reviewed: 07/15/2018 Elsevier Patient Education  2020 Elsevier Inc.  

## 2019-06-02 NOTE — Progress Notes (Signed)
Gynecology Annual Exam   PCP: Ronnell Freshwater, NP  Chief Complaint:  Chief Complaint  Patient presents with  . Annual Exam  . Pelvic Pain    History of Present Illness: Patient is a 33 y.o. [id number] presents for annual exam. The patient has complaint today of bilateral pelvic pain especially on the left. She has a history of ovarian cysts. For the past 3 months she has had left sided ovarian pain. More recently she began feeling right sided pain as well.   She has stopped taking Xanax and feels that she is coping well with anxiety. She has a 58 month old foster son and is in the process of adopting.   LMP: Patient's last menstrual period was 06/02/2019. Average Interval: regular, 25 days Duration of flow: 4-7 days Heavy Menses: yes Clots: no Intermenstrual Bleeding: no Postcoital Bleeding: yes Dysmenorrhea: yes  The patient is sexually active. She currently uses tubal ligation for contraception. She denies dyspareunia.  The patient does perform self breast exams.  There is notable family history of breast or ovarian cancer in her family. Her paternal grandmother had ovarian cancer. The patient has tested BRCA negative.  The patient wears seatbelts: yes.   The patient has regular exercise: She is active with her children.  She admits healthy diet and realizes she needs to drink more water. She gets about 5-6 hours sleep per night mostly due to having 102 month old baby.  The patient denies current symptoms of depression.    Review of Systems: Review of Systems  Constitutional: Positive for malaise/fatigue.  HENT: Negative.   Eyes: Negative.   Respiratory: Negative.   Cardiovascular: Negative.   Gastrointestinal: Negative.   Genitourinary:       Bilateral pelvic pain, bleeding after intercourse  Musculoskeletal: Positive for joint pain.  Skin: Negative.   Neurological: Negative.   Endo/Heme/Allergies: Bruises/bleeds easily.       Hot/cold intolerance   Psychiatric/Behavioral: Negative.     Past Medical History:  Past Medical History:  Diagnosis Date  . BRCA negative 05/2017   MyRisk neg  . Family history of ovarian cancer    10/18 MyRisk neg  . Gestational diabetes   . IBS (irritable bowel syndrome)   . Motion sickness    Rare  . Seasonal allergies   . Thrombocytopenia ([place])    while pregnant    Past Surgical History:  Past Surgical History:  Procedure Laterality Date  . ADENOIDECTOMY  2006  . COLONOSCOPY N/A 12/23/2014   Procedure: COLONOSCOPY;  Surgeon: Lucilla Lame, MD;  Location: [facility];  Service: Gastroenterology;  Laterality: N/A;  . TONSILLECTOMY  2006  . TUBAL LIGATION Bilateral 2013  . WISDOM TOOTH EXTRACTION  2005    Gynecologic History:  Patient's last menstrual period was 06/02/2019. Contraception: tubal ligation Last Pap: 3 years ago Results were: no abnormalities   Obstetric History: G2P0202  Family History:  Family History  Problem Relation Age of Onset  . Heart attack Mother   . Colon cancer Mother 33  . Diabetes Mellitus II Paternal Grandmother   . Ovarian cancer Paternal Grandmother     Social History:  Social History   Socioeconomic History  . Marital status: Married    Spouse name: Not on file  . Number of children: Not on file  . Years of education: Not on file  . Highest education level: Not on file  Occupational History  . Not on file  Social Needs  . Financial  resource strain: Not on file  . Food insecurity    Worry: Not on file    Inability: Not on file  . Transportation needs    Medical: Not on file    Non-medical: Not on file  Tobacco Use  . Smoking status: Former Smoker    Quit date: 08/05/2006    Years since quitting: 12.8  . Smokeless tobacco: Never Used  Substance and Sexual Activity  . Alcohol use: Yes    Alcohol/week: 5.0 standard drinks    Types: 5 Cans of beer per week    Comment: Occ  . Drug use: Never  . Sexual activity: Yes    Birth  control/protection: Surgical  Lifestyle  . Physical activity    Days per week: Not on file    Minutes per session: Not on file  . Stress: Not on file  Relationships  . Social Herbalist on phone: Not on file    Gets together: Not on file    Attends religious service: Not on file    Active member of club or organization: Not on file    Attends meetings of clubs or organizations: Not on file    Relationship status: Not on file  . Intimate partner violence    Fear of current or ex partner: Not on file    Emotionally abused: Not on file    Physically abused: Not on file    Forced sexual activity: Not on file  Other Topics Concern  . Not on file  Social History Narrative   Works at: [employer] in [place]:  Allergies  Allergen Reactions  . Ceclor [Cefaclor] Swelling  . Neosporin [Neomycin-Bacitracin Zn-Polymyx]     Medications: Prior to Admission medications   Not on File    Physical Exam Vitals: Blood pressure 122/74, height 5' 4"  (1.626 m), weight 144 lb (65.3 kg), last menstrual period 06/02/2019.  General: NAD HEENT: normocephalic, anicteric Thyroid: no enlargement, no palpable nodules Pulmonary: No increased work of breathing, CTAB Cardiovascular: RRR, distal pulses 2+ Breast: Breast symmetrical, no tenderness, no palpable nodules or masses, no skin or nipple retraction present, no nipple discharge.  No axillary or supraclavicular lymphadenopathy. Abdomen: NABS, soft, non-tender, non-distended.  Umbilicus without lesions.  No hepatomegaly, splenomegaly or masses palpable. No evidence of hernia  Genitourinary:  External: Normal external female genitalia.  Normal urethral meatus, normal Bartholin's and Skene's glands.    Vagina: Normal vaginal mucosa, no evidence of prolapse.    Cervix: Grossly normal in appearance, no bleeding, no CMT  Uterus: Non-enlarged, mobile, normal contour, mild tenderness to palpation.    Adnexa: ovaries  mildly enlarged, tender to palpation, no adnexal masses  Rectal: deferred  Lymphatic: no evidence of inguinal lymphadenopathy Extremities: no edema, erythema, or tenderness Neurologic: Grossly intact Psychiatric: mood appropriate, affect full   Assessment: 33 y.o. G2P0202 routine annual exam  Plan: Problem List Items Addressed This Visit    None    Visit Diagnoses    Well woman exam with routine gynecological exam    -  Primary   Relevant Orders   Cytology - PAP   Screen for sexually transmitted diseases       Relevant Orders   Cytology - PAP   Routine cervical smear       Relevant Orders   Cytology - PAP   Pain of ovary       Relevant Orders   US PELVIS TRANSVAGINAL NON-OB (TV ONLY)  1) STI screening  was offered and accepted  2)  ASCCP guidelines and rationale discussed.  Patient opts for every 3 years screening interval  3) Contraception - the patient is currently using  tubal ligation.  She is happy with her current form of contraception and plans to continue  4) Routine healthcare maintenance including cholesterol, diabetes screening discussed Declines  5) Return in about 1 week (around 06/09/2019) for gyn u/s and f/u after.   Rod Can, [facility] 06/02/2019, 2:58 PM

## 2019-06-09 LAB — CYTOLOGY - PAP
Chlamydia: NEGATIVE
Comment: NEGATIVE
Comment: NEGATIVE
Comment: NEGATIVE
Comment: NORMAL
Diagnosis: NEGATIVE
High risk HPV: NEGATIVE
Neisseria Gonorrhea: NEGATIVE
Trichomonas: NEGATIVE

## 2019-06-23 ENCOUNTER — Other Ambulatory Visit: Payer: Self-pay

## 2019-06-23 ENCOUNTER — Encounter: Payer: Self-pay | Admitting: Advanced Practice Midwife

## 2019-06-23 ENCOUNTER — Ambulatory Visit (INDEPENDENT_AMBULATORY_CARE_PROVIDER_SITE_OTHER): Payer: BC Managed Care – PPO | Admitting: Advanced Practice Midwife

## 2019-06-23 ENCOUNTER — Ambulatory Visit (INDEPENDENT_AMBULATORY_CARE_PROVIDER_SITE_OTHER): Payer: BC Managed Care – PPO

## 2019-06-23 VITALS — BP 100/60 | Ht 64.0 in | Wt 140.0 lb

## 2019-06-23 DIAGNOSIS — N83201 Unspecified ovarian cyst, right side: Secondary | ICD-10-CM

## 2019-06-23 DIAGNOSIS — Z09 Encounter for follow-up examination after completed treatment for conditions other than malignant neoplasm: Secondary | ICD-10-CM

## 2019-06-23 DIAGNOSIS — N9489 Other specified conditions associated with female genital organs and menstrual cycle: Secondary | ICD-10-CM

## 2019-06-23 NOTE — Progress Notes (Signed)
Patient ID: Holly Cox, female   DOB: 02-17-1986, 33 y.o.   MRN: [id number]  Reason for Consult: Follow-up    Subjective:     HPI:  Holly Cox is a 33 y.o. female being seen for follow up visit/Gyn ultrasound for pelvic pain. She has no new concerns today. We discussed the findings of the ultrasound and recommendations for further follow up per Dr Glennon Mac.   Options for treating pelvic pain: NSAIDs, progesterone birth control, [place], exploratory surgery for endometriosis, surgical treatments.  Past Medical History:  Diagnosis Date  . BRCA negative 05/2017   MyRisk neg  . Family history of ovarian cancer    10/18 MyRisk neg  . Gestational diabetes   . IBS (irritable bowel syndrome)   . Motion sickness    Rare  . Seasonal allergies   . Thrombocytopenia ([place])    while pregnant   Family History  Problem Relation Age of Onset  . Heart attack Mother   . Colon cancer Mother 58  . Diabetes Mellitus II Paternal Grandmother   . Ovarian cancer Paternal Grandmother    Past Surgical History:  Procedure Laterality Date  . ADENOIDECTOMY  2006  . COLONOSCOPY N/A 12/23/2014   Procedure: COLONOSCOPY;  Surgeon: Lucilla Lame, MD;  Location: [facility];  Service: Gastroenterology;  Laterality: N/A;  . TONSILLECTOMY  2006  . TUBAL LIGATION Bilateral 2013  . WISDOM TOOTH EXTRACTION  2005    Short Social History:  Social History   Tobacco Use  . Smoking status: Former Smoker    Quit date: 08/05/2006    Years since quitting: 12.8  . Smokeless tobacco: Never Used  Substance Use Topics  . Alcohol use: Yes    Alcohol/week: 5.0 standard drinks    Types: 5 Cans of beer per week    Comment: Occ    Allergies  Allergen Reactions  . Ceclor [Cefaclor] Swelling  . Neosporin [Neomycin-Bacitracin Zn-Polymyx]     No current outpatient medications on file.   No current facility-administered medications for this visit.     Review of Systems   Constitutional: Negative.   HENT: Negative.   Eyes: Negative.   Respiratory: Negative.   Cardiovascular: Negative.   Gastrointestinal: Negative.   Genitourinary: Negative.   Musculoskeletal: Negative.   Skin: Negative.   Neurological: Negative.   Endo/Heme/Allergies: Negative.   Psychiatric/Behavioral: Negative.         Objective:  Objective   Vitals:   06/23/19 1630  BP: 100/60  Weight: 140 lb (63.5 kg)  Height: 5' 4"  (1.626 m)   Body mass index is 24.03 kg/m. Constitutional: Well nourished, well developed female in no acute distress.  HEENT: normal Skin: Warm and dry.  Respiratory:  Normal respiratory effort Neuro: DTRs 2+, Cranial nerves grossly intact Psych: Alert and Oriented x3. No memory deficits. Normal mood and affect.  MS: normal gait, normal bilateral lower extremity ROM/strength/stability.   Data: Patient Name: Holly Cox DOB: 1986-07-01 MRN: [id number]  ULTRASOUND REPORT  Location: [facility]  Date of Service: 06/23/2019   Indications:Pelvic Pain Findings:  The uterus is anteverted and measures 8.8 x 5.8 x 4.3 cm. Echo texture is homogenous without evidence of focal masses. The Endometrium measures 11.1 mm.  Right Ovary measures 4.8 x 3.3 x 3.2 cm. It is not normal in appearance. There is a complex cyst in the right ovary. Blood flow is seen around the periphery of the cyst only. The cyst measures 27.1 x 24.5  x 23.9 mm Left Ovary measures 3.0 x 3.1 x 2.0 cm. It is normal in appearance. Survey of the adnexa demonstrates no adnexal masses. There is no free fluid in the cul de sac.  Impression: 1. There is a complex cyst in the right ovary which may represent a corpus luteal cyst.  2. Norma left ovary. 3. Normal appearing uterus.  4. Thick endometrium.   Recommendations: 1.Clinical correlation with the patient's History and Physical Exam.  Gweneth Dimitri, RT     Assessment/Plan:     33 yo G2 P0202 female with  pelvic pain, right ovarian cyst, thickened endometrium  Repeat ultrasound in 2-3 months for status of right ovary cyst Follow up with MD for options regarding pelvic pain  [facility] 06/23/2019, 5:13 PM

## 2020-01-05 ENCOUNTER — Telehealth: Payer: Self-pay | Admitting: Family Medicine

## 2020-01-05 NOTE — Telephone Encounter (Signed)
Patient would like to get tested for Hep C.

## 2020-01-05 NOTE — Telephone Encounter (Signed)
Call to client and counseled that routine Hepatitis C testing not done at [facility] (like HIV testing). Ms. Holly Cox referred to Holly Emerald RN - [facility] CD Coordinator for further questions. Jossie Ng, RN

## 2020-03-31 DIAGNOSIS — R350 Frequency of micturition: Secondary | ICD-10-CM | POA: Diagnosis not present

## 2020-04-06 DIAGNOSIS — R350 Frequency of micturition: Secondary | ICD-10-CM | POA: Diagnosis not present

## 2020-04-28 DIAGNOSIS — N898 Other specified noninflammatory disorders of vagina: Secondary | ICD-10-CM | POA: Diagnosis not present

## 2020-04-28 DIAGNOSIS — R300 Dysuria: Secondary | ICD-10-CM | POA: Diagnosis not present

## 2020-04-28 DIAGNOSIS — Z3202 Encounter for pregnancy test, result negative: Secondary | ICD-10-CM | POA: Diagnosis not present

## 2020-04-28 DIAGNOSIS — N760 Acute vaginitis: Secondary | ICD-10-CM | POA: Diagnosis not present

## 2020-06-20 NOTE — Telephone Encounter (Signed)
Can pt schedule a telephone appointment with you for her annual?

## 2021-02-09 ENCOUNTER — Other Ambulatory Visit: Payer: Self-pay | Admitting: Advanced Practice Midwife

## 2021-02-09 DIAGNOSIS — N924 Excessive bleeding in the premenopausal period: Secondary | ICD-10-CM

## 2021-02-09 NOTE — Progress Notes (Signed)
Order placed for gyn u/s per patient request

## 2021-02-27 ENCOUNTER — Ambulatory Visit
Admission: RE | Admit: 2021-02-27 | Discharge: 2021-02-27 | Disposition: A | Discharge status: Home / Self Care | Payer: 59 | Source: Ambulatory Visit | Attending: Advanced Practice Midwife | Admitting: Advanced Practice Midwife

## 2021-02-27 ENCOUNTER — Other Ambulatory Visit: Payer: Self-pay

## 2021-02-27 ENCOUNTER — Other Ambulatory Visit: Payer: Self-pay | Admitting: Advanced Practice Midwife

## 2021-02-27 DIAGNOSIS — N924 Excessive bleeding in the premenopausal period: Secondary | ICD-10-CM

## 2021-03-01 ENCOUNTER — Ambulatory Visit (INDEPENDENT_AMBULATORY_CARE_PROVIDER_SITE_OTHER): Payer: 59 | Admitting: Advanced Practice Midwife

## 2021-03-01 ENCOUNTER — Encounter: Payer: Self-pay | Admitting: Advanced Practice Midwife

## 2021-03-01 ENCOUNTER — Other Ambulatory Visit: Payer: Self-pay

## 2021-03-01 VITALS — BP 120/70 | Ht 64.0 in | Wt 141.0 lb

## 2021-03-01 DIAGNOSIS — Z0000 Encounter for general adult medical examination without abnormal findings: Secondary | ICD-10-CM | POA: Diagnosis not present

## 2021-03-01 NOTE — Progress Notes (Signed)
Gynecology Annual Exam   PCP: Carlean Jews, NP  Chief Complaint:  Chief Complaint  Patient presents with   Annual Exam    History of Present Illness: Patient is a 35 y.o. [id number] presents for annual exam. The patient has complaints today. She does mention some urinary frequency and urgency. Kegels recommended. She finished a course of antibiotics recently for UTI symptoms and reports improvement. She has some positional pain with intercourse.  We reviewed the results of pelvic ultrasound that she had 2 days ago with recommendation per Dr Jerene Pitch to have MD consult to determine recommended follow up.  LMP: Patient's last menstrual period was 02/05/2021. Average Interval: every 1-2 months Duration of flow:  3-7  days Heavy Menses: yes Clots: no Intermenstrual Bleeding: no Postcoital Bleeding: no Dysmenorrhea: yes  The patient is sexually active. She currently uses tubal ligation for contraception. She has dyspareunia.  The patient does perform self breast exams.  There is notable family history of breast or ovarian cancer in her family. She has had MyRisk testing and is negative.  The patient wears seatbelts: yes.   The patient has regular exercise: she is active at her job and with her children. She admits healthy diet, adequate hydration and adequate sleep. She has ongoing issues with IBS.  The patient denies current symptoms of depression.    Review of Systems: Review of Systems  Constitutional:  Positive for malaise/fatigue. Negative for chills and fever.  HENT:  Negative for congestion, ear discharge, ear pain, hearing loss, sinus pain and sore throat.   Eyes:  Negative for blurred vision and double vision.  Respiratory:  Negative for cough, shortness of breath and wheezing.   Cardiovascular:  Negative for chest pain, palpitations and leg swelling.  Gastrointestinal:  Positive for nausea. Negative for abdominal pain, blood in stool, constipation, diarrhea, heartburn,  melena and vomiting.       Positive for IBS  Genitourinary:  Positive for frequency and urgency. Negative for dysuria, flank pain and hematuria.  Musculoskeletal:  Negative for back pain, joint pain and myalgias.  Skin:  Negative for itching and rash.  Neurological:  Positive for headaches. Negative for dizziness, tingling, tremors, sensory change, speech change, focal weakness, seizures, loss of consciousness and weakness.  Endo/Heme/Allergies:  Negative for environmental allergies. Does not bruise/bleed easily.       Positive for most days hot flashes  Psychiatric/Behavioral:  Negative for depression, hallucinations, memory loss, substance abuse and suicidal ideas. The patient is not nervous/anxious and does not have insomnia.    Past Medical History:  There are no problems to display for this patient.   Past Surgical History:  Past Surgical History:  Procedure Laterality Date   ADENOIDECTOMY  2006   COLONOSCOPY N/A 12/23/2014   Procedure: COLONOSCOPY;  Surgeon: Midge Minium, MD;  Location: [facility];  Service: Gastroenterology;  Laterality: N/A;   TONSILLECTOMY  2006   TUBAL LIGATION Bilateral 2013   WISDOM TOOTH EXTRACTION  2005    Gynecologic History:  Patient's last menstrual period was 02/05/2021. Contraception: tubal ligation Last Pap: 2 years ago Results were: no abnormalities   Obstetric History: G2P0202  Family History:  Family History  Problem Relation Age of Onset   Heart attack Mother    Colon cancer Mother 55   Diabetes Mellitus II Paternal Grandmother    Ovarian cancer Paternal Grandmother     Social History:  Social History   Socioeconomic History   Marital status: Married  Spouse name: Not on file   Number of children: Not on file   Years of education: Not on file   Highest education level: Not on file  Occupational History   Not on file  Tobacco Use   Smoking status: Former    Types: Cigarettes    Quit date: 08/05/2006    Years since  quitting: 14.5   Smokeless tobacco: Never  Vaping Use   Vaping Use: Never used  Substance and Sexual Activity   Alcohol use: Yes    Alcohol/week: 5.0 standard drinks    Types: 5 Cans of beer per week    Comment: Occ   Drug use: Never   Sexual activity: Yes    Birth control/protection: Surgical  Other Topics Concern   Not on file  Social History Narrative   Works at: Software engineer in [employer]   Social Determinants of Health   Financial Resource Strain: Not on file  Food Insecurity: Not on file  Transportation Needs: Not on file  Physical Activity: Not on file  Stress: Not on file  Social Connections: Not on file  Intimate Partner Violence: Not on file    Allergies:  Allergies  Allergen Reactions   Ceclor [Cefaclor] Swelling   Neosporin [Neomycin-Bacitracin Zn-Polymyx]     Medications: Prior to Admission medications   Not on File    Physical Exam Vitals: Blood pressure 120/70, height 5\' 4"  (1.626 m), weight 141 lb (64 kg), last menstrual period 02/05/2021.  General: NAD HEENT: normocephalic, anicteric Thyroid: no enlargement, no palpable nodules Pulmonary: No increased work of breathing, CTAB Cardiovascular: RRR, distal pulses 2+ Breast: Breast symmetrical, no tenderness, no palpable nodules or masses, no skin or nipple retraction present, no nipple discharge.  No axillary or supraclavicular lymphadenopathy. Abdomen: NABS, soft, non-tender, non-distended.  Umbilicus without lesions.  No hepatomegaly, splenomegaly or masses palpable. No evidence of hernia  Genitourinary: deferred for no concerns/PAP interval Extremities: no edema, erythema, or tenderness Neurologic: Grossly intact Psychiatric: mood appropriate, affect full  CLINICAL DATA:  Initial evaluation for menorrhagia.   EXAM: TRANSABDOMINAL AND TRANSVAGINAL ULTRASOUND OF PELVIS   TECHNIQUE: Both transabdominal and transvaginal ultrasound examinations of the pelvis were performed.  Transabdominal technique was performed for global imaging of the pelvis including uterus, ovaries, adnexal regions, and pelvic cul-de-sac. It was necessary to proceed with endovaginal exam following the transabdominal exam to visualize the uterus, endometrium, and ovaries.   COMPARISON:  Prior ultrasound from 03/04/2006.   FINDINGS: Uterus   Measurements: 8.0 x 4.2 x 5.3 cm = volume: 93.0 mL. Uterus is anteverted. No discrete fibroid or other mass.   Endometrium   Endometrial complex measures up to 14.1 mm. There is a superimposed ovoid echogenic masslike area measuring approximately 1.3 x 1.0 x 3.1 cm. This involves the endometrial complex at the level of the uterine fundus (image 26). Evidence for associated vascularity (images 28, 29).   Right ovary   Measurements: 3.8 x 2.4 x 2.5 cm = volume: 12.2 mL. 2.2 x 1.7 x 2.3 cm complex hypoechoic cyst, which could reflect a degenerating corpus luteal cyst versus hemorrhagic cyst.   Left ovary   Measurements: 3.3 x 1.8 x 1.7 cm = volume: 5.0 mL. Normal appearance/no adnexal mass.   Other findings   No abnormal free fluid.   IMPRESSION: 1. 1.3 x 1.0 x 3.1 cm echogenic mass-like area involving the endometrial complex with associated vascularity. Finding is nonspecific, but could reflect an endometrial polyp, endometrial hyperplasia, or possibly endometrial carcinoma. Consider  further evaluation with sonohysterogram for confirmation prior to hysteroscopy. Endometrial sampling should also be considered if patient is at high risk for endometrial carcinoma. (Ref: Radiological Reasoning: Algorithmic Workup of Abnormal Vaginal Bleeding with Endovaginal Sonography and Sonohysterography. AJR 2008; 431:V40-08). 2. 2.3 cm complex right ovarian cyst, which could reflect a degenerating corpus luteal cyst versus hemorrhagic cyst. While this is almost certainly benign, a short interval follow-up ultrasound in 6-12 weeks could be  performed for further evaluation as warranted. 3. Normal sonographic appearance of the uterus and left ovary. No free fluid.   Electronically Signed   By: Rise Mu M.D.   On: 02/27/2021 19:41     Assessment: 35 y.o. G2P0202 routine annual exam  Plan: Problem List Items Addressed This Visit   None Visit Diagnoses     Well woman exam without gynecological exam    -  Primary       1) STI screening  was offered and declined  2)  ASCCP guidelines and rationale discussed.  Patient opts for every 3 years screening interval  3) Contraception - the patient is currently using  tubal ligation.    4) Routine healthcare maintenance including cholesterol, diabetes screening discussed Declines  5) Return in about 2 weeks (around 03/15/2021) for consult with MD for hysteroscopy if needed.   Tresea Mall, CNM [facility] 03/01/2021, 2:18 PM

## 2021-03-06 ENCOUNTER — Other Ambulatory Visit: Payer: Self-pay | Admitting: Obstetrics and Gynecology

## 2021-03-06 DIAGNOSIS — R102 Pelvic and perineal pain: Secondary | ICD-10-CM

## 2021-03-06 MED ORDER — TRAMADOL HCL 50 MG PO TABS
50.0000 mg | ORAL_TABLET | Freq: Four times a day (QID) | ORAL | 0 refills | Status: DC | PRN
Start: 2021-03-06 — End: 2021-04-03

## 2021-03-21 ENCOUNTER — Encounter: Payer: Self-pay | Admitting: Obstetrics and Gynecology

## 2021-03-21 ENCOUNTER — Other Ambulatory Visit: Payer: Self-pay

## 2021-03-21 ENCOUNTER — Ambulatory Visit (INDEPENDENT_AMBULATORY_CARE_PROVIDER_SITE_OTHER): Payer: 59 | Admitting: Obstetrics and Gynecology

## 2021-03-21 VITALS — BP 112/70 | Ht 64.0 in | Wt 142.8 lb

## 2021-03-21 DIAGNOSIS — R9389 Abnormal findings on diagnostic imaging of other specified body structures: Secondary | ICD-10-CM

## 2021-03-21 DIAGNOSIS — N83201 Unspecified ovarian cyst, right side: Secondary | ICD-10-CM

## 2021-03-21 DIAGNOSIS — R350 Frequency of micturition: Secondary | ICD-10-CM | POA: Diagnosis not present

## 2021-03-21 DIAGNOSIS — N921 Excessive and frequent menstruation with irregular cycle: Secondary | ICD-10-CM

## 2021-03-21 DIAGNOSIS — N946 Dysmenorrhea, unspecified: Secondary | ICD-10-CM

## 2021-03-21 DIAGNOSIS — N840 Polyp of corpus uteri: Secondary | ICD-10-CM | POA: Diagnosis not present

## 2021-03-21 NOTE — Progress Notes (Signed)
Patient ID: Holly Cox, female   DOB: 01-20-1986, 35 y.o.   MRN: [id number]  Reason for Consult: Gynecologic Exam   Referred by Ronnell Freshwater, NP  Subjective:     HPI:  Holly Cox is a 35 y.o. female she is here today for consultation.  She reports that she has been having problems with heavy bleeding during menstrual cycles.  And severe pelvic pain during her menstrual cycle.  She reports that her problems with these issues have been worsening over the last 1-1 and half years.  Recently she had a pelvic ultrasound that showed a possible polyp.  With her last menstrual cycle she passed tissue and she wonders if this polyp passed on its own.  She reports she has a history of ovarian cysts which generally have resolved on their own and she has not required surgery or surgery to remove the cyst in the past.  She has a history of polycystic ovarian syndrome.  She reports that it has been a long time question if she has endometriosis because of the pain that she experiences during her menstrual cycle.  She has not previously had laparoscopy to determine this.  She reports that she had an endometrial biopsy in the past but it was very painful to do in the office.  She has tried different types of contraception to help with her menstrual cycle without improvement.  She had a Mirena IUD but this caused worsened ovarian cyst.  She has previously been on patch pills and the NuvaRing.    Gynecological History  Patient's last menstrual period was 03/06/2021. Menarche: 15  She reports passage of large clots She reports sensations of gushing or flooding of blood. She reports accidents where she bleeds through her clothing. She reports that she changes a saturated pad or tampon more frequently than every hour.  She reports that pain from her periods limits her activities.  History of fibroids, polyps, or ovarian cysts? : yes  History of PCOS? yes Hstory of  Endometriosis? maybe History of abnormal pap smears? no Have you had any sexually transmitted infections in the past? no  She reports HPV vaccination in the past.    Last Pap:  She identifies as a female. She is sexually active with men.   She has dyspareunia. She denies postcoital bleeding.  She currently uses tubal ligation for contraception.   Obstetrical History OB History  Gravida Para Term Preterm AB Living  _0 SAB IAB Ectopic Multiple Live Births          2    # Outcome Date GA Lbr Len/2nd Weight Sex Delivery Anes PTL Lv  2 Preterm 07/11/12 [redacted]w[redacted]d 6 lb 14 oz (3.118 kg) F Vag-Spont  Y LIV     Birth Comments: GDM, Preeclampsia, Thrombcytopenia  1 Preterm 05/30/07 345w0d6 lb (2.722 kg)  Vag-Spont  Y LIV     Birth Comments: Preeclampsia     Past Medical History:  Diagnosis Date   BRCA negative 05/2017   MyRisk neg   Family history of ovarian cancer    10/18 MyRisk neg   Gestational diabetes    IBS (irritable bowel syndrome)    Motion sickness    Rare   Seasonal allergies    [place]rombocyt[place]   while [place]   Family History  Problem Relation Age of Onset   Heart attack Mother    Colon cancer Mother  49   Diabetes Mellitus II Paternal Grandmother    Ovarian cancer Paternal Grandmother    Past Surgical History:  Procedure Laterality Date   ADENOIDECTOMY  2006   COLONOSCOPY N/A 12/23/2014   Procedure: COLONOSCOPY;  Surgeon: Lucilla Lame, MD;  Location: [facility];  Service: Gastroenterology;  Laterality: N/A;   TONSILLECTOMY  2006   TUBAL LIGATION Bilateral 2013   WISDOM TOOTH EXTRACTION  2005    Short Social History:  Social History   Tobacco Use   Smoking status: Former    Types: Cigarettes    Quit date: 08/05/2006    Years since quitting: 14.6   Smokeless tobacco: Never  Substance Use Topics   Alcohol use: Yes    Alcohol/week: 5.0 standard drinks    Types: 5 Cans of beer per week    Comment: Occ    Allergies  Allergen  Reactions   Ceclor [Cefaclor] Swelling   Neosporin [Neomycin-Bacitracin Zn-Polymyx]     Current Outpatient Medications  Medication Sig Dispense Refill   traMADol (ULTRAM) 50 MG tablet Take 1 tablet (50 mg total) by mouth every 6 (six) hours as needed. 10 tablet 0   No current facility-administered medications for this visit.    Review of Systems  Constitutional: Negative for chills, fatigue, fever and unexpected weight change.  HENT: Negative for trouble swallowing.  Eyes: Negative for loss of vision.  Respiratory: Negative for cough, shortness of breath and wheezing.  Cardiovascular: Negative for chest pain, leg swelling, palpitations and syncope.  GI: Negative for abdominal pain, blood in stool, diarrhea, nausea and vomiting.  GU: Negative for difficulty urinating, dysuria, frequency and hematuria.  Musculoskeletal: Negative for back pain, leg pain and joint pain.  Skin: Negative for rash.  Neurological: Negative for dizziness, headaches, light-headedness, numbness and seizures.  Psychiatric: Negative for behavioral problem, confusion, depressed mood and sleep disturbance.       Objective:  Objective   Vitals:   03/21/21 1109  BP: 112/70  Weight: 142 lb 12.8 oz (64.8 kg)  Height: _0  (1.626 m)   Body mass index is 24.51 kg/m.  Physical Exam Vitals and nursing note reviewed. Exam conducted with a chaperone present.  Constitutional:      Appearance: Normal appearance.  HENT:     Head: Normocephalic and atraumatic.  Eyes:     Extraocular Movements: Extraocular movements intact.     Pupils: Pupils are equal, round, and reactive to light.  Cardiovascular:     Rate and Rhythm: Normal rate and regular rhythm.  Pulmonary:     Effort: Pulmonary effort is normal.     Breath sounds: Normal breath sounds.  Abdominal:     General: Abdomen is flat.     Palpations: Abdomen is soft.  Musculoskeletal:     Cervical back: Normal range of motion.  Skin:    General: Skin is  warm and dry.  Neurological:     General: No focal deficit present.     Mental Status: She is alert and oriented to person, place, and time.  Psychiatric:        Behavior: Behavior normal.        Thought Content: Thought content normal.        Judgment: Judgment normal.    Assessment/Plan:     35 yo with menorrhagia and dysmenorrhea. We will plan for hysteroscopy D&C to sample endometrial thickening. Given patient's longstanding history of dysmenorrhea we will plan for laparoscopy at the same time to evaluate for endometriosis. Referral  to urogynecology for urinary complaints.     Adrian Prows MD [facility] 03/21/2021 12:19 PM

## 2021-03-21 NOTE — Patient Instructions (Signed)
Urinary Frequency, Adult Urinary frequency means urinating more often than usual. You may urinate every 1-2 hours even though you drink a normal amount of fluid and do not have a bladder infection or condition. Although you urinate more often than normal,the total amount of urine produced in a day is normal. With urinary frequency, you may have an urgent need to urinate often. The stress and anxiety of needing to find a bathroom quickly can make this urge worse. This condition may go away on its own or you may need treatment at home. Home treatment may include bladder training, exercises, taking medicines, ormaking changes to your diet. Follow these instructions at home: Bladder health  Keep a bladder diary if told by your health care provider. Keep track of: What you eat and drink. How often you urinate. How much you urinate. Follow a bladder training program if told by your health care provider. This may include: Learning to delay going to the bathroom. Double urinating (voiding). This helps if you are not completely emptying your bladder. Scheduled voiding. Do Kegel exercises as told by your health care provider. Kegel exercises strengthen the muscles that help control urination, which may help the condition.  Eating and drinking If told by your health care provider, make diet changes, such as: Avoiding caffeine. Drinking fewer fluids, especially alcohol. Not drinking in the evening. Avoiding foods or drinks that may irritate the bladder. These include coffee, tea, soda, artificial sweeteners, citrus, tomato-based foods, and chocolate. Eating foods that help prevent or ease constipation. Constipation can make this condition worse. Your health care provider may recommend that you: Drink enough fluid to keep your urine pale yellow. Take over-the-counter or prescription medicines. Eat foods that are high in fiber, such as beans, whole grains, and fresh fruits and vegetables. Limit foods  that are high in fat and processed sugars, such as fried or sweet foods. General instructions Take over-the-counter and prescription medicines only as told by your health care provider. Keep all follow-up visits as told by your health care provider. This is important. Contact a health care provider if: You start urinating more often. You feel pain or irritation when you urinate. You notice blood in your urine. Your urine looks cloudy. You develop a fever. You begin vomiting. Get help right away if: You are unable to urinate. Summary Urinary frequency means urinating more often than usual. With urinary frequency, you may urinate every 1-2 hours even though you drink a normal amount of fluid and do not have a bladder infection or other bladder condition. Your health care provider may recommend that you keep a bladder diary, follow a bladder training program, or make dietary changes. If told by your health care provider, do Kegel exercises to strengthen the muscles that help control urination. Take over-the-counter and prescription medicines only as told by your health care provider. Contact a health care provider if your symptoms do not improve or get worse. This information is not intended to replace advice given to you by your health care provider. Make sure you discuss any questions you have with your healthcare provider. Document Revised: 01/29/2018 Document Reviewed: 01/29/2018 Elsevier Patient Education  2021 Elsevier Inc[employer]   https://[email or web address].org/womens-health/faqs/ovarian-cysts">  Ovarian Cystectomy Ovarian cystectomy is a procedure that is done to remove a fluid-filled sac (cyst) on an ovary. The ovaries are small organs that produce eggs in women. Various types of cysts can form on the ovaries. Most cysts are not cancerous. This procedure may be done for cysts that are  large, cause symptoms, or do not go away on their own. It may also be done for a cyst that is cancerous or might  becancerous. This surgery can be done using a laparoscopic technique or an open abdominal technique. The laparoscopic technique is minimally invasive and results in smaller incisions and a faster recovery. The technique used will depend on your age, the type of cyst that you have, and whether the cyst is cancerous. Thelaparoscopic technique is not used for a cancerous cyst. Tell a health care provider about: Any allergies you have. All medicines you are taking, including vitamins, herbs, eye drops, creams, and over-the-counter medicines. Any problems you or family members have had with anesthetic medicines. Any blood disorders you have. Any surgeries you have had. Any medical conditions you have. Whether you are pregnant or may be pregnant. What are the risks? Generally, this is a safe procedure. However, problems may occur, including: Excessive bleeding. Infection. Damage to nearby structures or organs. Allergic reactions to medicines. Blood clots. Inability to get pregnant (infertility). What happens before the procedure? Staying hydrated Follow instructions from your health care provider about hydration, which may include: Up to 2 hours before the procedure - you may continue to drink clear liquids, such as water, clear fruit juice, black coffee, and plain tea.  Eating and drinking restrictions Follow instructions from your health care provider about eating and drinking, which may include: 8 hours before the procedure - stop eating heavy meals or foods, such as meat, fried foods, or fatty foods. 6 hours before the procedure - stop eating light meals or foods, such as toast or cereal. 6 hours before the procedure - stop drinking milk or drinks that contain milk. 2 hours before the procedure - stop drinking clear liquids. Medicines Ask your health care provider about: Changing or stopping your regular medicines. This is especially important if you are taking diabetes medicines or  blood thinners. Taking medicines such as aspirin and ibuprofen. These medicines can thin your blood. Do not take these medicines unless your health care provider tells you to take them. Taking over-the-counter medicines, vitamins, herbs, and supplements. General instructions Do not use any products that contain nicotine or tobacco for at least 4 weeks before the procedure. These products include cigarettes, e-cigarettes, and chewing tobacco. If you need help quitting, ask your health care provider. Ask your health care provider: How your surgery site will be marked. What steps will be taken to help prevent infection. These may include: Removing hair at the surgery site. Washing skin with a germ-killing soap. Taking antibiotic medicine. You may be asked to shower with a germ-killing soap. Plan to have someone take you home from the hospital or clinic. Plan to have someone help with household activities for 1-2 weeks after the procedure. Let your health care provider know if you develop a cold or any infection before your surgery. What happens during the procedure? An IV will be inserted into one of your veins. You will be given one or more of the following: A medicine to help you relax (sedative). A medicine to make you fall asleep (general anesthetic). Small monitors will be attached to your body. They will be used to check your heart, blood pressure, and oxygen level. A breathing tube will be placed to help you breathe during the procedure. Your surgeon will do the surgery using either the laparoscopic technique or the open abdominal technique. Laparoscopic technique  Several small incisions will be made in your abdomen. Your abdomen  will be filled with carbon dioxide gas to make it expand. This will give the surgeon more room to operate. It will also make your organs easier to see. A thin scope with a camera (laparoscope) will be put through one of the small incisions. The laparoscope  will send a picture to a monitor in the operating room to help the surgeon see inside your body. Hollow tubes will be put through the other small incisions in your abdomen. The tools needed for the procedure will be put through these tubes. The ovary with the cyst will be identified, and the cyst will be removed. The tools will then be removed, and the incisions will be closed with stitches or skin glue. Bandages (dressings) may be applied to the incisions.  Open abdominal technique A single, large incision will be made along your bikini line or in the middle of your lower abdomen. The ovary with the cyst will be identified, and the cyst will be removed. The incision will then be closed with stitches or staples. Bandages (dressings) may be applied to the incisions. The procedure may vary among health care providers and hospitals. What happens after the procedure? Your blood pressure, heart rate, breathing rate, and blood oxygen level will be monitored until you leave the hospital or clinic. Your IV will be removed after you are able to eat and drink well. You may be given medicine for pain or to help you sleep. You may be given an antibiotic medicine. Do not drive for 24 hours if you were given a sedative during the procedure. The cyst that was removed will be sent to the lab for testing. If the cyst has cancer cells, both ovaries may need to be removed during a different surgery. It is up to you to get the results of your procedure. Ask your health care provider, or the department that is doing the procedure, when your results will be ready. Summary Ovarian cystectomy is a procedure that is done to remove a cyst on an ovary. This procedure may be done for cysts that are large, cause symptoms, or do not go away on their own. It may also be done for a cyst that is cancerous or might be cancerous. Follow instructions from your health care provider about eating and drinking before the  procedure. After the cyst is removed, it will be sent to the lab for testing. This information is not intended to replace advice given to you by your health care provider. Make sure you discuss any questions you have with your healthcare provider. Document Revised: 12/30/2019 Document Reviewed: 12/30/2019 Elsevier Patient Education  2022 Else[employer] Inc[employer] Hysteroscopy Hysteroscopy is a procedure used to look inside a woman's womb (uterus). This may be done for various reasons, including: To look for tumors and other growths in the uterus. To evaluate abnormal bleeding, fibroid tumors, polyps, scar tissue, or uterine cancer. To determine why a woman is unable to get pregnant or has had repeated pregnancy losses. To locate an IUD (intrauterine device). To place a birth control device into the fallopian tubes. During this procedure, a thin, flexible tube with a small light and camera (hysteroscope) is used to examine the uterus. The camera sends images to a monitor in the room so that your health care provider can view the inside of your uterus. Ahysteroscopy should be done right after a menstrual period. Tell a health care provider about: Any allergies you have. All medicines you are taking, including vitamins, herbs, eye drops, creams,  and over-the-counter medicines. Any problems you or family members have had with anesthetic medicines. Any blood disorders you have. Any surgeries you have had. Any medical conditions you have. Whether you are pregnant or may be pregnant. Whether you have been diagnosed with an STI (sexually transmitted infection) or you think you have an STI. What are the risks? Generally, this is a safe procedure. However, problems may occur, including: Excessive bleeding. Infection. Damage to the uterus or other structures or organs. Allergic reaction to medicines or fluids that are used in the procedure. What happens before the procedure? Staying hydrated Follow  instructions from your health care provider about hydration, which may include: Up to 2 hours before the procedure - you may continue to drink clear liquids, such as water, clear fruit juice, black coffee, and plain tea. Eating and drinking restrictions Follow instructions from your health care provider about eating and drinking, which may include: 8 hours before the procedure - stop eating solid foods and drink clear liquids only. 2 hours before the procedure - stop drinking clear liquids. Medicines Ask your health care provider about: Changing or stopping your regular medicines. This is especially important if you are taking diabetes medicines or blood thinners. Taking medicines such as aspirin and ibuprofen. These medicines can thin your blood. Do not take these medicines unless your health care provider tells you to take them. Taking over-the-counter medicines, vitamins, herbs, and supplements. Medicine may be placed in your cervix the day before the procedure. This medicine causes the cervix to open (dilate). The larger opening makes it easier for the hysteroscope to be inserted into the uterus during the procedure. General instructions Ask your health care provider: What steps will be taken to help prevent infection. These steps may include: Washing skin with a germ-killing soap. Taking antibiotic medicine. Do not use any products that contain nicotine or tobacco for at least 4 weeks before the procedure. These products include cigarettes, chewing tobacco, and vaping devices, such as e-cigarettes. If you need help quitting, ask your health care provider. Plan to have a responsible adult take you home from the hospital or clinic. Plan to have a responsible adult care for you for the time you are told after you leave the hospital or clinic. This is important. Empty your bladder before the procedure begins. What happens during the procedure? An IV will be inserted into one of your  veins. You may be given: A medicine to help you relax (sedative). A medicine that numbs the area around the cervix (local anesthetic). A medicine to make you fall asleep (general anesthetic). A hysteroscope will be inserted through your vagina and into your uterus. Air or fluid will be used to enlarge your uterus to allow your health care provider to see it better. The amount of fluid used will be carefully checked throughout the procedure. In some cases, tissue may be gently scraped from inside the uterus and sent to a lab for testing (biopsy). The procedure may vary among health care providers and hospitals. What can I expect after the procedure? Your blood pressure, heart rate, breathing rate, and blood oxygen level will be monitored until you leave the hospital or clinic. You may have cramps. You may be given medicines for this. You may have bleeding, which may vary from light spotting to menstrual-like bleeding. This is normal. If you had a biopsy, it is up to you to get the results. Ask your health care provider, or the department that is doing the procedure,  when your results will be ready. Follow these instructions at home: Activity Rest as told by your health care provider. Return to your normal activities as told by your health care provider. Ask your health care provider what activities are safe for you. If you were given a sedative during the procedure, it can affect you for several hours. Do not drive or operate machinery until your health care provider says that it is safe. Medicines Do not take aspirin or other NSAIDs during recovery, as told by your healthcare provider. It can increase the risk of bleeding. Ask your health care provider if the medicine prescribed to you: Requires you to avoid driving or using machinery. Can cause constipation. You may need to take these actions to prevent or treat constipation: Drink enough fluid to keep your urine pale yellow. Take  over-the-counter or prescription medicines. Eat foods that are high in fiber, such as beans, whole grains, and fresh fruits and vegetables. Limit foods that are high in fat and processed sugars, such as fried or sweet foods. General instructions Do not douche, use tampons, or have sex for 2 weeks after the procedure, or until your health care provider approves. Do not take baths, swim, or use a hot tub until your health care provider approves. Take showers instead of baths for 2 weeks, or for as long as told by your health care provider. Keep all follow-up visits. This is important. Contact a health care provider if: You feel dizzy or lightheaded. You feel nauseous. You have abnormal vaginal discharge. You have a rash. You have pain that does not get better with medicine. You have chills. Get help right away if: You have bleeding that is heavier than a normal menstrual period. You have a fever. You have pain or cramps that get worse. You develop new abdominal pain. You faint. You have pain in your shoulder. You are short of breath. Summary Hysteroscopy is a procedure that is used to look inside a woman's womb (uterus). After the procedure, you may have bleeding, which varies from light spotting to menstrual-like bleeding. This is normal. You may also have cramps. Do not douche, use tampons, or have sex for 2 weeks after the procedure, or until your health care provider approves. Plan to have a responsible adult take you home from the hospital or clinic. This information is not intended to replace advice given to you by your health care provider. Make sure you discuss any questions you have with your healthcare provider. Document Revised: 03/08/2020 Document Reviewed: 03/08/2020 Elsevier Patient Education  2022 [employer].

## 2021-04-03 ENCOUNTER — Other Ambulatory Visit: Payer: Self-pay | Admitting: Obstetrics and Gynecology

## 2021-04-03 DIAGNOSIS — R102 Pelvic and perineal pain: Secondary | ICD-10-CM

## 2021-04-03 MED ORDER — TRAMADOL HCL 50 MG PO TABS: 50.0000 mg | ORAL_TABLET | Freq: Four times a day (QID) | ORAL | 0 refills | Status: DC | PRN

## 2021-04-11 ENCOUNTER — Telehealth: Payer: Self-pay

## 2021-04-11 NOTE — Telephone Encounter (Signed)
-----   Message from Natale Milch, MD sent at 04/03/2021 12:58 PM EDT ----- Regarding: surgery Surgery Booking Request Patient Full Name:  Holly Cox  MRN: [id number]  DOB: 07-Jul-1986  Surgeon: Natale Milch, MD  Requested Surgery Date and Time: next 1-3 months Primary Diagnosis AND Code: Thickened endometrium, abnormal uterine bleeding, menorrhagia, dysmenorrhea Secondary Diagnosis and Code:  Surgical Procedure: Hysteroscopy D&C, xi robotic laparoscopy with possible endometriosis excision RNFA Requested?: Yes L&D Notification: No Admission Status: same day surgery Length of Surgery: 125 min Special Case Needs: No H&P: Yes Phone Interview???:  Yes Interpreter: No Medical Clearance:  No Special Scheduling Instructions: No Any known health/anesthesia issues, diabetes, sleep apnea, latex allergy, defibrillator/pacemaker?: No Acuity: P3   (P1 highest, P2 delay may cause harm, P3 low, elective gyn, P4 lowest) Post op follow up visits: 1 week post op

## 2021-04-11 NOTE — Telephone Encounter (Signed)
Called patient to schedule Hysteroscopy D&C, xi robotic laparoscopy with possible endometriosis excision w [facility]  DOS 9/27  H&P 9/21 @ 2:10   Pre-admit phone call appointment to be requested - date and time will be included on H&P paper work. Also all appointments will be updated on pt MyChart. Explained that this appointment has a call window. Based on the time scheduled will indicate if the call will be received within a 4 hour window before 1:00 or after.  Advised that pt may also receive calls from the hospital pharmacy and pre-service center.  Confirmed pt has [employer] as primary insurance. No secondary insurance.   Case is in dep[place] waiting to be boarded on 9/17

## 2021-04-25 ENCOUNTER — Other Ambulatory Visit: Payer: Self-pay

## 2021-04-25 ENCOUNTER — Ambulatory Visit (INDEPENDENT_AMBULATORY_CARE_PROVIDER_SITE_OTHER): Payer: 59 | Admitting: Obstetrics and Gynecology

## 2021-04-25 VITALS — BP 118/70 | Ht 64.0 in | Wt 145.8 lb

## 2021-04-25 DIAGNOSIS — R9389 Abnormal findings on diagnostic imaging of other specified body structures: Secondary | ICD-10-CM

## 2021-04-25 DIAGNOSIS — N83201 Unspecified ovarian cyst, right side: Secondary | ICD-10-CM | POA: Diagnosis not present

## 2021-04-25 DIAGNOSIS — N946 Dysmenorrhea, unspecified: Secondary | ICD-10-CM | POA: Diagnosis not present

## 2021-04-25 DIAGNOSIS — R102 Pelvic and perineal pain: Secondary | ICD-10-CM | POA: Diagnosis not present

## 2021-04-25 NOTE — Progress Notes (Signed)
Patient ID: Holly Cox, female   DOB: [birth year], 35 y.o.   MRN: [id number]  Reason for Consult: No chief complaint on file.   Referred by Ronnell Freshwater, NP  Subjective:     HPI:  Holly Cox is a 35 y.o. female. She is here today for a pre op visit.  Planned hysteroscopy D&C, Xi Robotic laparoscopic right ovarian cystectomy with possible endometriosis resection.   She reports the right ovarian cyst was first seen several years ago on a CT at [facility] and that she has persistent right sided pain.   From her prior note:  Holly Cox is a 35 y.o. female she is here today for consultation.  She reports that she has been having problems with heavy bleeding during menstrual cycles.  And severe pelvic pain during her menstrual cycle.  She reports that her problems with these issues have been worsening over the last 1-one and half years.  Recently she had a pelvic ultrasound that showed a possible polyp.  With her last menstrual cycle she passed tissue and she wonders if this polyp passed on its own.   She reports she has a history of ovarian cysts which generally have resolved on their own and she has not required surgery or surgery to remove the cyst in the past.   She has a history of polycystic ovarian syndrome.  She reports that it has been a long time question if she has endometriosis because of the pain that she experiences during her menstrual cycle.  She has not previously had laparoscopy to determine this.   She reports that she had an endometrial biopsy in the past but it was very painful to do in the office.   She has tried different types of contraception to help with her menstrual cycle without improvement.  She had a Mirena IUD but this caused worsened ovarian cyst.  She has previously been on patch pills and the NuvaRing.   Gynecological History  Patient's last menstrual period was 03/31/2021.  Past Medical History:  Diagnosis Date    BRCA negative 05/2017   MyRisk neg   Family history of ovarian cancer    10/18 MyRisk neg   Gestational diabetes    IBS (irritable bowel syndrome)    Motion sickness    Rare   Seasonal allergies    Thrombocytopenia ([place])    while pregnant   Family History  Problem Relation Age of Onset   Heart attack Mother    Colon cancer Mother 62   Diabetes Mellitus II Paternal Grandmother    Ovarian cancer Paternal Grandmother    Past Surgical History:  Procedure Laterality Date   ADENOIDECTOMY  2006   COLONOSCOPY N/A 12/23/2014   Procedure: COLONOSCOPY;  Surgeon: Lucilla Lame, MD;  Location: [place];  Service: Gastroenterology;  Laterality: N/A;   TONSILLECTOMY  2006   TUBAL LIGATION Bilateral 2013   WISDOM TOOTH EXTRACTION  2005    Short Social History:  Social History   Tobacco Use   Smoking status: Former    Types: Cigarettes    Quit date: 08/05/2006    Years since quitting: 14.7   Smokeless tobacco: Never  Substance Use Topics   Alcohol use: Yes    Alcohol/week: 5.0 standard drinks    Types: 5 Cans of beer per week    Comment: Occ    Allergies  Allergen Reactions   Ceclor [Cefaclor] Swelling   Neosporin [Neomycin-Bacitracin Zn-Polymyx] Swelling  Current Outpatient Medications  Medication Sig Dispense Refill   acetaminophen (TYLENOL) 500 MG tablet Take 1,000 mg by mouth every 8 (eight) hours as needed for moderate pain.     naproxen sodium (ALEVE) 220 MG tablet Take 440 mg by mouth 2 (two) times daily as needed (pain).     traMADol (ULTRAM) 50 MG tablet Take 1 tablet (50 mg total) by mouth every 6 (six) hours as needed. 20 tablet 0   No current facility-administered medications for this visit.    Review of Systems  Constitutional: Negative for chills, fatigue, fever and unexpected weight change.  HENT: Negative for trouble swallowing.  Eyes: Negative for loss of vision.  Respiratory: Negative for cough, shortness of breath and wheezing.   Cardiovascular: Negative for chest pain, leg swelling, palpitations and syncope.  GI: Negative for abdominal pain, blood in stool, diarrhea, nausea and vomiting.  GU: Negative for difficulty urinating, dysuria, frequency and hematuria.  Musculoskeletal: Negative for back pain, leg pain and joint pain.  Skin: Negative for rash.  Neurological: Negative for dizziness, headaches, light-headedness, numbness and seizures.  Psychiatric: Negative for behavioral problem, confusion, depressed mood and sleep disturbance.       Objective:  Objective   Vitals:   04/25/21 1404  BP: 118/70  Weight: 145 lb 12.8 oz (66.1 kg)  Height: _0  (1.626 m)   Body mass index is 25.03 kg/m.  Physical Exam Vitals and nursing note reviewed. Exam conducted with a chaperone present.  Constitutional:      Appearance: Normal appearance.  HENT:     Head: Normocephalic and atraumatic.  Eyes:     Extraocular Movements: Extraocular movements intact.     Pupils: Pupils are equal, round, and reactive to light.  Cardiovascular:     Rate and Rhythm: Normal rate and regular rhythm.  Pulmonary:     Effort: Pulmonary effort is normal.     Breath sounds: Normal breath sounds.  Abdominal:     General: Abdomen is flat.     Palpations: Abdomen is soft.  Musculoskeletal:     Cervical back: Normal range of motion.  Skin:    General: Skin is warm and dry.  Neurological:     General: No focal deficit present.     Mental Status: She is alert and oriented to person, place, and time.  Psychiatric:        Behavior: Behavior normal.        Thought Content: Thought content normal.        Judgment: Judgment normal.    Assessment/Plan:     35 yo with dysmenorrhea, right ovarian cyst, endometrial thickening with planned hysteroscopy D&C, Xi Robotic laparoscopic right ovarian cystectomy with possible endometriosis resection.   We reviewed risks benefits and alternatives to the surgery.  We discussed risk of bleeding,  risk of infection, and risk of damage to surrounding pelvic structures.  We discussed. Expected post operative recovery and pain control options for after the surgery.  All questions were answered  More than 20 minutes were spent face to face with the patient in the room, reviewing the medical record, labs and images, and coordinating care for the patient. The plan of management was discussed in detail and counseling was provided.    Adrian Prows MD [facility] 04/25/2021 2:26 PM

## 2021-04-25 NOTE — H&P (View-Only) (Signed)
Patient ID: Holly Cox, female   DOB: [birth year], 35 y.o.   MRN: [id number]  Reason for Consult: No chief complaint on file.   Referred by Ronnell Freshwater, NP  Subjective:     HPI:  Holly Cox is a 35 y.o. female. She is here today for a pre op visit.  Planned hysteroscopy D&C, Xi Robotic laparoscopic right ovarian cystectomy with possible endometriosis resection.   She reports the right ovarian cyst was first seen several years ago on a CT at [facility] and that she has persistent right sided pain.   From her prior note:  Holly Cox is a 35 y.o. female she is here today for consultation.  She reports that she has been having problems with heavy bleeding during menstrual cycles.  And severe pelvic pain during her menstrual cycle.  She reports that her problems with these issues have been worsening over the last 1-one and half years.  Recently she had a pelvic ultrasound that showed a possible polyp.  With her last menstrual cycle she passed tissue and she wonders if this polyp passed on its own.   She reports she has a history of ovarian cysts which generally have resolved on their own and she has not required surgery or surgery to remove the cyst in the past.   She has a history of polycystic ovarian syndrome.  She reports that it has been a long time question if she has endometriosis because of the pain that she experiences during her menstrual cycle.  She has not previously had laparoscopy to determine this.   She reports that she had an endometrial biopsy in the past but it was very painful to do in the office.   She has tried different types of contraception to help with her menstrual cycle without improvement.  She had a Mirena IUD but this caused worsened ovarian cyst.  She has previously been on patch pills and the NuvaRing.   Gynecological History  Patient's last menstrual period was 03/31/2021.  Past Medical History:  Diagnosis Date    BRCA negative 05/2017   MyRisk neg   Family history of ovarian cancer    10/18 MyRisk neg   Gestational diabetes    IBS (irritable bowel syndrome)    Motion sickness    Rare   Seasonal allergies    Thrombocytopenia ([place])    while pregnant   Family History  Problem Relation Age of Onset   Heart attack Mother    Colon cancer Mother 62   Diabetes Mellitus II Paternal Grandmother    Ovarian cancer Paternal Grandmother    Past Surgical History:  Procedure Laterality Date   ADENOIDECTOMY  2006   COLONOSCOPY N/A 12/23/2014   Procedure: COLONOSCOPY;  Surgeon: Lucilla Lame, MD;  Location: [place];  Service: Gastroenterology;  Laterality: N/A;   TONSILLECTOMY  2006   TUBAL LIGATION Bilateral 2013   WISDOM TOOTH EXTRACTION  2005    Short Social History:  Social History   Tobacco Use   Smoking status: Former    Types: Cigarettes    Quit date: 08/05/2006    Years since quitting: 14.7   Smokeless tobacco: Never  Substance Use Topics   Alcohol use: Yes    Alcohol/week: 5.0 standard drinks    Types: 5 Cans of beer per week    Comment: Occ    Allergies  Allergen Reactions   Ceclor [Cefaclor] Swelling   Neosporin [Neomycin-Bacitracin Zn-Polymyx] Swelling  Current Outpatient Medications  Medication Sig Dispense Refill   acetaminophen (TYLENOL) 500 MG tablet Take 1,000 mg by mouth every 8 (eight) hours as needed for moderate pain.     naproxen sodium (ALEVE) 220 MG tablet Take 440 mg by mouth 2 (two) times daily as needed (pain).     traMADol (ULTRAM) 50 MG tablet Take 1 tablet (50 mg total) by mouth every 6 (six) hours as needed. 20 tablet 0   No current facility-administered medications for this visit.    Review of Systems  Constitutional: Negative for chills, fatigue, fever and unexpected weight change.  HENT: Negative for trouble swallowing.  Eyes: Negative for loss of vision.  Respiratory: Negative for cough, shortness of breath and wheezing.   Cardiovascular: Negative for chest pain, leg swelling, palpitations and syncope.  GI: Negative for abdominal pain, blood in stool, diarrhea, nausea and vomiting.  GU: Negative for difficulty urinating, dysuria, frequency and hematuria.  Musculoskeletal: Negative for back pain, leg pain and joint pain.  Skin: Negative for rash.  Neurological: Negative for dizziness, headaches, light-headedness, numbness and seizures.  Psychiatric: Negative for behavioral problem, confusion, depressed mood and sleep disturbance.       Objective:  Objective   Vitals:   04/25/21 1404  BP: 118/70  Weight: 145 lb 12.8 oz (66.1 kg)  Height: _0  (1.626 m)   Body mass index is 25.03 kg/m.  Physical Exam Vitals and nursing note reviewed. Exam conducted with a chaperone present.  Constitutional:      Appearance: Normal appearance.  HENT:     Head: Normocephalic and atraumatic.  Eyes:     Extraocular Movements: Extraocular movements intact.     Pupils: Pupils are equal, round, and reactive to light.  Cardiovascular:     Rate and Rhythm: Normal rate and regular rhythm.  Pulmonary:     Effort: Pulmonary effort is normal.     Breath sounds: Normal breath sounds.  Abdominal:     General: Abdomen is flat.     Palpations: Abdomen is soft.  Musculoskeletal:     Cervical back: Normal range of motion.  Skin:    General: Skin is warm and dry.  Neurological:     General: No focal deficit present.     Mental Status: She is alert and oriented to person, place, and time.  Psychiatric:        Behavior: Behavior normal.        Thought Content: Thought content normal.        Judgment: Judgment normal.    Assessment/Plan:     35 yo with dysmenorrhea, right ovarian cyst, endometrial thickening with planned hysteroscopy D&C, Xi Robotic laparoscopic right ovarian cystectomy with possible endometriosis resection.   We reviewed risks benefits and alternatives to the surgery.  We discussed risk of bleeding,  risk of infection, and risk of damage to surrounding pelvic structures.  We discussed. Expected post operative recovery and pain control options for after the surgery.  All questions were answered  More than 20 minutes were spent face to face with the patient in the room, reviewing the medical record, labs and images, and coordinating care for the patient. The plan of management was discussed in detail and counseling was provided.    Adrian Prows MD [facility] 04/25/2021 2:26 PM

## 2021-04-26 ENCOUNTER — Encounter
Admission: RE | Admit: 2021-04-26 | Discharge: 2021-04-26 | Disposition: A | Discharge status: Home / Self Care | Payer: 59 | Source: Ambulatory Visit | Attending: Obstetrics and Gynecology | Admitting: Obstetrics and Gynecology

## 2021-04-26 NOTE — Patient Instructions (Addendum)
Your procedure is scheduled LF:[id number] 05/01/21 Report to the Registration Desk on the 1st floor of the [facility]. To find out your arrival time, please call [phone] between 1PM - 3PM UM:PNTIRW 04/30/21  REMEMBER: Instructions that are not followed completely may result in serious medical risk, up to and including death; or upon the discretion of your surgeon and anesthesiologist your surgery may need to be rescheduled.  Do not eat food after midnight the night before surgery.  No gum chewing, lozengers or hard candies.  You may however, drink CLEAR liquids up to 2 hours before you are scheduled to arrive for your surgery. Do not drink anything within 2 hours of your scheduled arrival time.  Clear liquids include: - water  - apple juice without pulp - gatorade (not RED, PURPLE, OR BLUE) - black coffee or tea (Do NOT add milk or creamers to the coffee or tea) Do NOT drink anything that is not on this list.  In addition, your doctor has ordered for you to drink the provided  Ensure Pre-Surgery Clear Carbohydrate Drink   Drinking this carbohydrate drink up to two hours before surgery helps to reduce insulin resistance and improve patient outcomes. Please complete drinking 2 hours prior to scheduled arrival time.  TAKE THESE MEDICATIONS THE MORNING OF SURGERY WITH A SIP OF WATER: Tylenol (if needed) Tramadol (if needed)  One week prior to surgery: Stop Anti-inflammatories (NSAIDS) Aleve and others such as Advil, Ibuprofen, Motrin, Naproxen, Naprosyn and Aspirin based products such as Excedrin, Goodys Powder, BC Powder. Stop ANY OVER THE COUNTER supplements until after surgery. You may however, continue to take Tylenol if needed for pain up until the day of surgery.  No Alcohol for 24 hours before or after surgery.  No Smoking including e-cigarettes for 24 hours prior to surgery.  No chewable tobacco products for at least 6 hours prior to surgery.  No nicotine patches on the  day of surgery.  Do not use any "recreational" drugs for at least a week prior to your surgery.  Please be advised that the combination of cocaine and anesthesia may have negative outcomes, up to and including death. If you test positive for cocaine, your surgery will be cancelled.  On the morning of surgery brush your teeth with toothpaste and water, you may rinse your mouth with mouthwash if you wish. Do not swallow any toothpaste or mouthwash.  Use CHG Soap or wipes as directed on instruction sheet.  Do not wear jewelry, make-up, hairpins, clips or nail polish.  Do not wear lotions, powders, or perfumes.   Do not shave body from the neck down 48 hours prior to surgery just in case you cut yourself which could leave a site for infection.  Also, freshly shaved skin may become irritated if using the CHG soap.  Contact lenses, hearing aids and dentures may not be worn into surgery.  Do not bring valuables to the hospital. [facility] is not responsible for any missing/lost belongings or valuables.   Notify your doctor if there is any change in your medical condition (cold, fever, infection).  Wear comfortable clothing (specific to your surgery type) to the hospital.  After surgery, you can help prevent lung complications by doing breathing exercises.  Take deep breaths and cough every 1-2 hours. Your doctor may order a device called an Incentive Spirometer to help you take deep breaths. When coughing or sneezing, hold a pillow firmly against your incision with both hands. This is called "splinting."  Doing this helps protect your incision. It also decreases belly discomfort.  If you are being discharged the day of surgery, you will not be allowed to drive home. You will need a responsible adult (18 years or older) to drive you home and stay with you that night.   If you are taking public transportation, you will need to have a responsible adult (18 years or older) with you. Please  confirm with your physician that it is acceptable to use public transportation.   Please call the Pre-admissions Testing Dept. at [phone] if you have any questions about these instructions.  Surgery Visitation Policy:  Patients undergoing a surgery or procedure may have one family member or support person with them as long as that person is not COVID-19 positive or experiencing its symptoms.  That person may remain in the waiting area during the procedure and may rotate out with other people.  Inpatient Visitation:    Visiting hours are 7 a.m. to 8 p.m. Up to two visitors ages 16+ are allowed at one time in a patient room. The visitors may rotate out with other people during the day. Visitors must check out when they leave, or other visitors will not be allowed. One designated support person may remain overnight. The visitor must pass COVID-19 screenings, use hand sanitizer when entering and exiting the patient's room and wear a mask at all times, including in the patient's room. Patients must also wear a mask when staff or their visitor are in the room. Masking is required regardless of vaccination status.

## 2021-04-27 ENCOUNTER — Encounter
Admission: RE | Admit: 2021-04-27 | Discharge: 2021-04-27 | Disposition: A | Discharge status: Home / Self Care | Payer: 59 | Source: Ambulatory Visit | Attending: Obstetrics and Gynecology | Admitting: Obstetrics and Gynecology

## 2021-04-27 ENCOUNTER — Other Ambulatory Visit: Payer: Self-pay

## 2021-04-27 DIAGNOSIS — Z01812 Encounter for preprocedural laboratory examination: Secondary | ICD-10-CM | POA: Diagnosis present

## 2021-04-27 LAB — CBC
HCT: 37.9 % (ref 36.0–46.0)
Hemoglobin: 13.1 g/dL (ref 12.0–15.0)
MCH: 31 pg (ref 26.0–34.0)
MCHC: 34.6 g/dL (ref 30.0–36.0)
MCV: 89.8 fL (ref 80.0–100.0)
Platelets: 143 10*3/uL — ABNORMAL LOW (ref 150–400)
RBC: 4.22 MIL/uL (ref 3.87–5.11)
RDW: 12.6 % (ref 11.5–15.5)
WBC: 4.5 10*3/uL (ref 4.0–10.5)
nRBC: 0 % (ref 0.0–0.2)

## 2021-04-27 LAB — TYPE AND SCREEN
ABO/RH(D): O POS
Antibody Screen: NEGATIVE

## 2021-05-01 ENCOUNTER — Ambulatory Visit: Payer: 59 | Admitting: Registered Nurse

## 2021-05-01 ENCOUNTER — Encounter
Admission: RE | Disposition: A | Discharge status: Home / Self Care | Payer: Self-pay | Source: Home / Self Care | Attending: Obstetrics and Gynecology

## 2021-05-01 ENCOUNTER — Ambulatory Visit
Admission: RE | Admit: 2021-05-01 | Discharge: 2021-05-01 | Disposition: A | Discharge status: Home / Self Care | Payer: 59 | Attending: Obstetrics and Gynecology | Admitting: Obstetrics and Gynecology

## 2021-05-01 DIAGNOSIS — Z881 Allergy status to other antibiotic agents status: Secondary | ICD-10-CM | POA: Insufficient documentation

## 2021-05-01 DIAGNOSIS — Z833 Family history of diabetes mellitus: Secondary | ICD-10-CM | POA: Diagnosis not present

## 2021-05-01 DIAGNOSIS — Z8632 Personal history of gestational diabetes: Secondary | ICD-10-CM | POA: Diagnosis not present

## 2021-05-01 DIAGNOSIS — R102 Pelvic and perineal pain: Secondary | ICD-10-CM | POA: Diagnosis not present

## 2021-05-01 DIAGNOSIS — R9389 Abnormal findings on diagnostic imaging of other specified body structures: Secondary | ICD-10-CM | POA: Insufficient documentation

## 2021-05-01 DIAGNOSIS — N83201 Unspecified ovarian cyst, right side: Secondary | ICD-10-CM | POA: Diagnosis not present

## 2021-05-01 DIAGNOSIS — N838 Other noninflammatory disorders of ovary, fallopian tube and broad ligament: Secondary | ICD-10-CM | POA: Insufficient documentation

## 2021-05-01 DIAGNOSIS — N9489 Other specified conditions associated with female genital organs and menstrual cycle: Secondary | ICD-10-CM | POA: Diagnosis not present

## 2021-05-01 DIAGNOSIS — Z87891 Personal history of nicotine dependence: Secondary | ICD-10-CM | POA: Diagnosis not present

## 2021-05-01 DIAGNOSIS — Z888 Allergy status to other drugs, medicaments and biological substances status: Secondary | ICD-10-CM | POA: Diagnosis not present

## 2021-05-01 HISTORY — PX: ROBOTIC ASSISTED LAPAROSCOPIC OVARIAN CYSTECTOMY: SHX6081

## 2021-05-01 HISTORY — PX: HYSTEROSCOPY WITH D & C: SHX1775

## 2021-05-01 LAB — POCT PREGNANCY, URINE: Preg Test, Ur: NEGATIVE

## 2021-05-01 LAB — GLUCOSE, CAPILLARY: Glucose-Capillary: 123 mg/dL — ABNORMAL HIGH (ref 70–99)

## 2021-05-01 SURGERY — DILATATION AND CURETTAGE /HYSTEROSCOPY
Anesthesia: General

## 2021-05-01 MED ORDER — OXYCODONE HCL 5 MG PO TABS
ORAL_TABLET | ORAL | Status: AC
  Filled 2021-05-01: qty 1

## 2021-05-01 MED ORDER — MEPERIDINE HCL 25 MG/ML IJ SOLN
INTRAMUSCULAR | Status: AC
  Filled 2021-05-01: qty 1

## 2021-05-01 MED ORDER — FAMOTIDINE 20 MG PO TABS
ORAL_TABLET | ORAL | Status: AC
  Filled 2021-05-01: qty 1

## 2021-05-01 MED ORDER — FENTANYL CITRATE (PF) 100 MCG/2ML IJ SOLN
INTRAMUSCULAR | Status: AC
  Filled 2021-05-01: qty 2

## 2021-05-01 MED ORDER — OXYCODONE HCL 5 MG PO TABS: 5.0000 mg | ORAL_TABLET | Freq: Four times a day (QID) | ORAL | 0 refills | Status: AC | PRN

## 2021-05-01 MED ORDER — LACTATED RINGERS IV SOLN: INTRAVENOUS | Status: DC

## 2021-05-01 MED ORDER — ONDANSETRON HCL 4 MG/2ML IJ SOLN
INTRAMUSCULAR | Status: DC | PRN
  Administered 2021-05-01: 4 mg via INTRAVENOUS

## 2021-05-01 MED ORDER — OXYCODONE HCL 5 MG/5ML PO SOLN
5.0000 mg | Freq: Once | ORAL | Status: AC | PRN
Start: 2021-05-01 — End: 2021-05-01

## 2021-05-01 MED ORDER — PROMETHAZINE HCL 25 MG/ML IJ SOLN
INTRAMUSCULAR | Status: AC
  Filled 2021-05-01: qty 1

## 2021-05-01 MED ORDER — ROCURONIUM BROMIDE 10 MG/ML (PF) SYRINGE
PREFILLED_SYRINGE | INTRAVENOUS | Status: AC
  Filled 2021-05-01: qty 10

## 2021-05-01 MED ORDER — PROPOFOL 10 MG/ML IV BOLUS
INTRAVENOUS | Status: AC
  Filled 2021-05-01: qty 20

## 2021-05-01 MED ORDER — LIDOCAINE HCL (CARDIAC) PF 100 MG/5ML IV SOSY
PREFILLED_SYRINGE | INTRAVENOUS | Status: DC | PRN
  Administered 2021-05-01: 80 mg via INTRAVENOUS

## 2021-05-01 MED ORDER — MIDAZOLAM HCL 2 MG/2ML IJ SOLN
INTRAMUSCULAR | Status: DC | PRN
  Administered 2021-05-01: 2 mg via INTRAVENOUS

## 2021-05-01 MED ORDER — MEPERIDINE HCL 25 MG/ML IJ SOLN
6.2500 mg | INTRAMUSCULAR | Status: DC | PRN
  Administered 2021-05-01: 6.25 mg via INTRAVENOUS

## 2021-05-01 MED ORDER — DEXMEDETOMIDINE (PRECEDEX) IN NS 20 MCG/5ML (4 MCG/ML) IV SYRINGE
PREFILLED_SYRINGE | INTRAVENOUS | Status: AC
  Filled 2021-05-01: qty 5

## 2021-05-01 MED ORDER — IBUPROFEN 600 MG PO TABS: 600.0000 mg | ORAL_TABLET | Freq: Four times a day (QID) | ORAL | 0 refills | Status: AC | PRN

## 2021-05-01 MED ORDER — 0.9 % SODIUM CHLORIDE (POUR BTL) OPTIME
TOPICAL | Status: DC | PRN
  Administered 2021-05-01: 1643 mL
  Administered 2021-05-01: 100 mL

## 2021-05-01 MED ORDER — LIDOCAINE HCL (PF) 2 % IJ SOLN
INTRAMUSCULAR | Status: AC
  Filled 2021-05-01: qty 5

## 2021-05-01 MED ORDER — MIDAZOLAM HCL 2 MG/2ML IJ SOLN
INTRAMUSCULAR | Status: AC
  Filled 2021-05-01: qty 2

## 2021-05-01 MED ORDER — BUPIVACAINE LIPOSOME 1.3 % IJ SUSP
INTRAMUSCULAR | Status: AC
  Filled 2021-05-01: qty 20

## 2021-05-01 MED ORDER — KETOROLAC TROMETHAMINE 30 MG/ML IJ SOLN
INTRAMUSCULAR | Status: DC | PRN
  Administered 2021-05-01: 30 mg via INTRAVENOUS

## 2021-05-01 MED ORDER — SUGAMMADEX SODIUM 200 MG/2ML IV SOLN
INTRAVENOUS | Status: DC | PRN
  Administered 2021-05-01 (×2): 100 mg via INTRAVENOUS

## 2021-05-01 MED ORDER — GLYCOPYRROLATE 0.2 MG/ML IJ SOLN
INTRAMUSCULAR | Status: AC
  Filled 2021-05-01: qty 1

## 2021-05-01 MED ORDER — ROCURONIUM BROMIDE 100 MG/10ML IV SOLN
INTRAVENOUS | Status: DC | PRN
  Administered 2021-05-01: 40 mg via INTRAVENOUS
  Administered 2021-05-01: 20 mg via INTRAVENOUS
  Administered 2021-05-01: 10 mg via INTRAVENOUS
  Administered 2021-05-01: 20 mg via INTRAVENOUS

## 2021-05-01 MED ORDER — SEVOFLURANE IN SOLN
RESPIRATORY_TRACT | Status: AC
  Filled 2021-05-01: qty 250

## 2021-05-01 MED ORDER — KETOROLAC TROMETHAMINE 30 MG/ML IJ SOLN
INTRAMUSCULAR | Status: AC
  Filled 2021-05-01: qty 1

## 2021-05-01 MED ORDER — ORAL CARE MOUTH RINSE: 15.0000 mL | Freq: Once | OROMUCOSAL | Status: AC

## 2021-05-01 MED ORDER — DEXAMETHASONE SODIUM PHOSPHATE 10 MG/ML IJ SOLN
INTRAMUSCULAR | Status: AC
  Filled 2021-05-01: qty 1

## 2021-05-01 MED ORDER — DEXAMETHASONE SODIUM PHOSPHATE 10 MG/ML IJ SOLN
INTRAMUSCULAR | Status: DC | PRN
  Administered 2021-05-01: 10 mg via INTRAVENOUS

## 2021-05-01 MED ORDER — GLYCOPYRROLATE 0.2 MG/ML IJ SOLN
INTRAMUSCULAR | Status: DC | PRN
  Administered 2021-05-01: .2 mg via INTRAVENOUS

## 2021-05-01 MED ORDER — POVIDONE-IODINE 10 % EX SWAB: 2.0000 "application " | Freq: Once | CUTANEOUS | Status: DC

## 2021-05-01 MED ORDER — PROPOFOL 10 MG/ML IV BOLUS
INTRAVENOUS | Status: DC | PRN
  Administered 2021-05-01: 120 mg via INTRAVENOUS

## 2021-05-01 MED ORDER — FAMOTIDINE 20 MG PO TABS
20.0000 mg | ORAL_TABLET | Freq: Once | ORAL | Status: AC
  Administered 2021-05-01: 20 mg via ORAL

## 2021-05-01 MED ORDER — FENTANYL CITRATE (PF) 100 MCG/2ML IJ SOLN
INTRAMUSCULAR | Status: DC | PRN
  Administered 2021-05-01: 50 ug via INTRAVENOUS
  Administered 2021-05-01 (×3): 25 ug via INTRAVENOUS
  Administered 2021-05-01: 50 ug via INTRAVENOUS
  Administered 2021-05-01: 25 ug via INTRAVENOUS

## 2021-05-01 MED ORDER — CHLORHEXIDINE GLUCONATE 0.12 % MT SOLN
15.0000 mL | Freq: Once | OROMUCOSAL | Status: AC
  Administered 2021-05-01: 15 mL via OROMUCOSAL

## 2021-05-01 MED ORDER — FENTANYL CITRATE (PF) 100 MCG/2ML IJ SOLN
25.0000 ug | INTRAMUSCULAR | Status: DC | PRN
  Administered 2021-05-01 (×4): 25 ug via INTRAVENOUS

## 2021-05-01 MED ORDER — BUPIVACAINE LIPOSOME 1.3 % IJ SUSP
INTRAMUSCULAR | Status: DC | PRN
  Administered 2021-05-01: 20 mL

## 2021-05-01 MED ORDER — ONDANSETRON HCL 4 MG/2ML IJ SOLN
INTRAMUSCULAR | Status: AC
  Filled 2021-05-01: qty 2

## 2021-05-01 MED ORDER — PHENYLEPHRINE HCL (PRESSORS) 10 MG/ML IV SOLN
INTRAVENOUS | Status: AC
  Filled 2021-05-01: qty 1

## 2021-05-01 MED ORDER — PROMETHAZINE HCL 25 MG/ML IJ SOLN
6.2500 mg | INTRAMUSCULAR | Status: DC | PRN
  Administered 2021-05-01: 6.25 mg via INTRAVENOUS

## 2021-05-01 MED ORDER — OXYCODONE HCL 5 MG PO TABS
5.0000 mg | ORAL_TABLET | Freq: Once | ORAL | Status: AC | PRN
  Administered 2021-05-01: 5 mg via ORAL

## 2021-05-01 MED ORDER — PHENYLEPHRINE HCL (PRESSORS) 10 MG/ML IV SOLN
INTRAVENOUS | Status: DC | PRN
  Administered 2021-05-01 (×9): 100 ug via INTRAVENOUS

## 2021-05-01 MED ORDER — CHLORHEXIDINE GLUCONATE 0.12 % MT SOLN
OROMUCOSAL | Status: AC
  Filled 2021-05-01: qty 15

## 2021-05-01 SURGICAL SUPPLY — 85 items
ADH SKN CLS APL DERMABOND .7 (GAUZE/BANDAGES/DRESSINGS) ×1
ANCHOR TIS RET SYS 235ML (MISCELLANEOUS) ×2 IMPLANT
APL PRP STRL LF DISP 70% ISPRP (MISCELLANEOUS) ×1
BAG DRN RND TRDRP ANRFLXCHMBR (UROLOGICAL SUPPLIES) ×1
BAG INFUSER PRESSURE 100CC (MISCELLANEOUS) IMPLANT
BAG TISS RTRVL C235 10X14 (MISCELLANEOUS) ×1
BAG URINE DRAIN 2000ML AR STRL (UROLOGICAL SUPPLIES) ×2 IMPLANT
BLADE SURG SZ11 CARB STEEL (BLADE) ×2 IMPLANT
CANNULA REDUC XI 12-8 STAPL (CANNULA) ×1
CANNULA REDUCER 12-8 DVNC XI (CANNULA) ×1 IMPLANT
CATH FOLEY 2WAY  5CC 16FR (CATHETERS) ×1
CATH FOLEY 2WAY 5CC 16FR (CATHETERS) ×1
CATH ROBINSON RED A/P 16FR (CATHETERS) ×2 IMPLANT
CATH URTH 16FR FL 2W BLN LF (CATHETERS) ×1 IMPLANT
CHLORAPREP W/TINT 26 (MISCELLANEOUS) ×2 IMPLANT
COVER TIP SHEARS 8 DVNC (MISCELLANEOUS) ×1 IMPLANT
COVER TIP SHEARS 8MM DA VINCI (MISCELLANEOUS) ×1
DEFOGGER SCOPE WARMER CLEARIFY (MISCELLANEOUS) ×2 IMPLANT
DERMABOND ADVANCED (GAUZE/BANDAGES/DRESSINGS) ×1
DERMABOND ADVANCED .7 DNX12 (GAUZE/BANDAGES/DRESSINGS) ×1 IMPLANT
DEVICE MYOSURE LITE (MISCELLANEOUS) IMPLANT
DEVICE MYOSURE REACH (MISCELLANEOUS) ×2 IMPLANT
DRAPE 3/4 80X56 (DRAPES) ×4 IMPLANT
DRAPE ARM DVNC X/XI (DISPOSABLE) ×4 IMPLANT
DRAPE COLUMN DVNC XI (DISPOSABLE) ×1 IMPLANT
DRAPE DA VINCI XI ARM (DISPOSABLE) ×4
DRAPE DA VINCI XI COLUMN (DISPOSABLE) ×1
DRAPE UNDER BUTTOCK W/FLU (DRAPES) ×2 IMPLANT
DRSG TEGADERM 2-3/8X2-3/4 SM (GAUZE/BANDAGES/DRESSINGS) ×8 IMPLANT
DRSG TELFA 3X8 NADH (GAUZE/BANDAGES/DRESSINGS) ×4 IMPLANT
ELECT REM PT RETURN 9FT ADLT (ELECTROSURGICAL) ×2
ELECTRODE REM PT RTRN 9FT ADLT (ELECTROSURGICAL) ×1 IMPLANT
GAUZE 4X4 16PLY ~~LOC~~+RFID DBL (SPONGE) ×4 IMPLANT
GLOVE SURG SYN 6.5 ES PF (GLOVE) ×2 IMPLANT
GLOVE SURG UNDER POLY LF SZ6.5 (GLOVE) ×4 IMPLANT
GOWN STRL REUS W/ TWL LRG LVL3 (GOWN DISPOSABLE) ×8 IMPLANT
GOWN STRL REUS W/TWL LRG LVL3 (GOWN DISPOSABLE) ×16
GRASPER SUT TROCAR 14GX15 (MISCELLANEOUS) IMPLANT
IRRIGATION STRYKERFLOW (MISCELLANEOUS) ×1 IMPLANT
IRRIGATOR STRYKERFLOW (MISCELLANEOUS) ×2
IRRIGATOR SUCT 8 DISP DVNC XI (IRRIGATION / IRRIGATOR) IMPLANT
IRRIGATOR SUCTION 8MM XI DISP (IRRIGATION / IRRIGATOR)
IV NS 1000ML (IV SOLUTION) ×2
IV NS 1000ML BAXH (IV SOLUTION) ×1 IMPLANT
IV NS IRRIG 3000ML ARTHROMATIC (IV SOLUTION) ×2 IMPLANT
KIT PINK PAD W/HEAD ARE REST (MISCELLANEOUS) ×2
KIT PINK PAD W/HEAD ARM REST (MISCELLANEOUS) ×1 IMPLANT
KIT PROCEDURE FLUENT (KITS) IMPLANT
LABEL OR SOLS (LABEL) ×2 IMPLANT
MANIFOLD NEPTUNE II (INSTRUMENTS) ×2 IMPLANT
MANIPULATOR UTERINE 4.5 ZUMI (MISCELLANEOUS) ×2 IMPLANT
NEEDLE HYPO 22GX1.5 SAFETY (NEEDLE) ×2 IMPLANT
NS IRRIG 1000ML POUR BTL (IV SOLUTION) ×2 IMPLANT
OBTURATOR OPTICAL STANDARD 8MM (TROCAR) ×1
OBTURATOR OPTICAL STND 8 DVNC (TROCAR) ×1
OBTURATOR OPTICALSTD 8 DVNC (TROCAR) ×1 IMPLANT
PACK DNC HYST (MISCELLANEOUS) ×2 IMPLANT
PACK GYN LAPAROSCOPIC (MISCELLANEOUS) ×2 IMPLANT
PAD ARMBOARD 7.5X6 YLW CONV (MISCELLANEOUS) ×2 IMPLANT
PAD OB MATERNITY 4.3X12.25 (PERSONAL CARE ITEMS) ×2 IMPLANT
PAD PREP 24X41 OB/GYN DISP (PERSONAL CARE ITEMS) ×2 IMPLANT
RETRACTOR WOUND ALXS 18CM SML (MISCELLANEOUS) IMPLANT
RTRCTR WOUND ALEXIS O 18CM SML (MISCELLANEOUS)
SCRUB EXIDINE 4% CHG 4OZ (MISCELLANEOUS) ×2 IMPLANT
SEAL CANN UNIV 5-8 DVNC XI (MISCELLANEOUS) ×4 IMPLANT
SEAL ROD LENS SCOPE MYOSURE (ABLATOR) ×2 IMPLANT
SEAL XI 5MM-8MM UNIVERSAL (MISCELLANEOUS) ×4
SEALER VESSEL DA VINCI XI (MISCELLANEOUS)
SEALER VESSEL EXT DVNC XI (MISCELLANEOUS) IMPLANT
SET CYSTO W/LG BORE CLAMP LF (SET/KITS/TRAYS/PACK) IMPLANT
SET TUBE SMOKE EVAC HIGH FLOW (TUBING) ×2 IMPLANT
SOLUTION ELECTROLUBE (MISCELLANEOUS) ×2 IMPLANT
SPONGE GAUZE 2X2 8PLY STRL LF (GAUZE/BANDAGES/DRESSINGS) ×8 IMPLANT
STAPLER CANNULA SEAL DVNC XI (STAPLE) ×1 IMPLANT
STAPLER CANNULA SEAL XI (STAPLE) ×1
STRAP SAFETY 5IN WIDE (MISCELLANEOUS) ×2 IMPLANT
SURGILUBE 2OZ TUBE FLIPTOP (MISCELLANEOUS) ×2 IMPLANT
SUT MNCRL 4-0 (SUTURE) ×2
SUT MNCRL 4-0 27XMFL (SUTURE) ×1
SUT VIC AB 0 CT1 36 (SUTURE) ×2 IMPLANT
SUT VLOC 180 0 6IN GS21 (SUTURE) IMPLANT
SUTURE MNCRL 4-0 27XMF (SUTURE) ×1 IMPLANT
SYR 10ML LL (SYRINGE) ×2 IMPLANT
TOWEL OR 17X26 4PK STRL BLUE (TOWEL DISPOSABLE) ×2 IMPLANT
WATER STERILE IRR 500ML POUR (IV SOLUTION) ×2 IMPLANT

## 2021-05-01 NOTE — Anesthesia Preprocedure Evaluation (Signed)
Anesthesia Evaluation  Patient identified by MRN, date of birth, ID band Patient awake    Reviewed: Allergy & Precautions, NPO status , Patient's Chart, lab work & pertinent test results  History of Anesthesia Complications Negative for: history of anesthetic complications  Airway Mallampati: II  TM Distance: >3 FB Neck ROM: Full    Dental no notable dental hx.    Pulmonary neg pulmonary ROS, neg sleep apnea, neg COPD, former smoker,    breath sounds clear to auscultation- rhonchi (-) wheezing      Cardiovascular Exercise Tolerance: Good (-) hypertension(-) CAD and (-) Past MI  Rhythm:Regular Rate:Normal - Systolic murmurs and - Diastolic murmurs    Neuro/Psych negative neurological ROS  negative psych ROS   GI/Hepatic negative GI ROS, Neg liver ROS,   Endo/Other  negative endocrine ROSneg diabetes  Renal/GU negative Renal ROS     Musculoskeletal negative musculoskeletal ROS (+)   Abdominal (+) - obese,   Peds  Hematology negative hematology ROS (+)   Anesthesia Other Findings   Reproductive/Obstetrics                             Anesthesia Physical Anesthesia Plan  ASA: 1  Anesthesia Plan: General   Post-op Pain Management:    Induction: Intravenous  PONV Risk Score and Plan: 2 and Ondansetron, Dexamethasone and Midazolam  Airway Management Planned: Oral ETT  Additional Equipment:   Intra-op Plan:   Post-operative Plan: Extubation in OR  Informed Consent: I have reviewed the patients History and Physical, chart, labs and discussed the procedure including the risks, benefits and alternatives for the proposed anesthesia with the patient or authorized representative who has indicated his/her understanding and acceptance.     Dental advisory given  Plan Discussed with: CRNA and Anesthesiologist  Anesthesia Plan Comments:         Anesthesia Quick Evaluation

## 2021-05-01 NOTE — Discharge Instructions (Addendum)

## 2021-05-01 NOTE — Interval H&P Note (Signed)
History and Physical Interval Note:  05/01/2021 9:09 AM  Holly Cox  has presented today for surgery, with the diagnosis of Thickened endometrium, abnormal uterine bleeding, menorrhagia, dysmenorrhea.  The various methods of treatment have been discussed with the patient and family. After consideration of risks, benefits and other options for treatment, the patient has consented to  Procedure(s): DILATATION AND CURETTAGE /HYSTEROSCOPY (N/A) XI ROBOTIC ASSISTED LAPAROSCOPIC OVARIAN CYSTECTOMY (N/A) as a surgical intervention.  The patient's history has been reviewed, patient examined, no change in status, stable for surgery.  I have reviewed the patient's chart and labs.  Questions were answered to the patient's satisfaction.     [name]

## 2021-05-01 NOTE — Transfer of Care (Signed)
Immediate Anesthesia Transfer of Care Note  Patient: Holly Cox  Procedure(s) Performed: DILATATION AND CURETTAGE /HYSTEROSCOPY XI ROBOTIC ASSISTED LAPAROSCOPIC OVARIAN CYSTECTOMY, ENDOMETRIOSIS RESECTION  Patient Location: PACU  Anesthesia Type:General  Level of Consciousness: awake, alert  and oriented  Airway & Oxygen Therapy: Patient Spontanous Breathing  Post-op Assessment: Report given to RN and Post -op Vital signs reviewed and stable  Post vital signs: Reviewed and stable  Last Vitals:  Vitals Value Taken Time  BP 90/58 05/01/21 1233  Temp 36.9 C 05/01/21 1233  Pulse 84 05/01/21 1233  Resp 16 05/01/21 1233  SpO2 100 % 05/01/21 1233  Vitals shown include unvalidated device data.  Last Pain:  Vitals:   05/01/21 0840  TempSrc: Temporal  PainSc: 0-No pain         Complications: No notable events documented.

## 2021-05-01 NOTE — Anesthesia Procedure Notes (Signed)
Procedure Name: Intubation Date/Time: 05/01/2021 9:20 AM Performed by: Doreen Salvage, CRNA Pre-anesthesia Checklist: Patient identified, Patient being monitored, Timeout performed, Emergency Drugs available and Suction available Patient Re-evaluated:Patient Re-evaluated prior to induction Oxygen Delivery Method: Circle system utilized Preoxygenation: Pre-oxygenation with 100% oxygen Induction Type: IV induction Ventilation: Mask ventilation without difficulty Laryngoscope Size: Mac, 3 and McGraph Grade View: Grade I Tube type: Oral Tube size: 7.0 mm Number of attempts: 1 Airway Equipment and Method: Stylet Placement Confirmation: ETT inserted through vocal cords under direct vision, positive ETCO2 and breath sounds checked- equal and bilateral Secured at: 23 cm Tube secured with: Tape Dental Injury: Teeth and Oropharynx as per pre-operative assessment

## 2021-05-02 ENCOUNTER — Encounter: Payer: Self-pay | Admitting: Obstetrics and Gynecology

## 2021-05-02 LAB — SURGICAL PATHOLOGY

## 2021-05-02 NOTE — Anesthesia Postprocedure Evaluation (Signed)
Anesthesia Post Note  Patient: Braxtyn Dorff Shoe-Thacker  Procedure(s) Performed: DILATATION AND CURETTAGE /HYSTEROSCOPY XI ROBOTIC ASSISTED LAPAROSCOPIC OVARIAN CYSTECTOMY, ENDOMETRIOSIS RESECTION  Patient location during evaluation: PACU Anesthesia Type: General Level of consciousness: awake and alert and oriented Pain management: pain level controlled Vital Signs Assessment: post-procedure vital signs reviewed and stable Respiratory status: spontaneous breathing, nonlabored ventilation and respiratory function stable Cardiovascular status: blood pressure returned to baseline and stable Postop Assessment: no signs of nausea or vomiting Anesthetic complications: no   No notable events documented.   Last Vitals:  Vitals:   05/01/21 1437 05/01/21 1455  BP: (!) 87/58 96/61  Pulse: 70   Resp: 16 16  Temp:    SpO2: 98%     Last Pain:  Vitals:   05/01/21 1455  TempSrc:   PainSc: 3                  Kais Monje

## 2021-05-08 ENCOUNTER — Encounter: Payer: Self-pay | Admitting: Obstetrics and Gynecology

## 2021-05-08 ENCOUNTER — Other Ambulatory Visit: Payer: Self-pay

## 2021-05-08 ENCOUNTER — Ambulatory Visit (INDEPENDENT_AMBULATORY_CARE_PROVIDER_SITE_OTHER): Payer: 59 | Admitting: Obstetrics and Gynecology

## 2021-05-08 VITALS — BP 100/72 | Ht 64.0 in | Wt 145.4 lb

## 2021-05-08 DIAGNOSIS — N809 Endometriosis, unspecified: Secondary | ICD-10-CM

## 2021-05-08 DIAGNOSIS — Z3009 Encounter for other general counseling and advice on contraception: Secondary | ICD-10-CM

## 2021-05-08 DIAGNOSIS — Z48816 Encounter for surgical aftercare following surgery on the genitourinary system: Secondary | ICD-10-CM

## 2021-05-08 DIAGNOSIS — Z4889 Encounter for other specified surgical aftercare: Secondary | ICD-10-CM

## 2021-05-08 MED ORDER — LEVONORGEST-ETH ESTRAD 91-DAY 0.15-0.03 MG PO TABS: 1.0000 | ORAL_TABLET | Freq: Every day | ORAL | 4 refills | Status: AC

## 2021-05-08 NOTE — Progress Notes (Signed)
  Postoperative Follow-up Patient presents post op from  [facility] Robot-assisted Laparoscopic Endometriosis Resection   for Pelvic pain , 1 week ago.  Subjective: She reports that since the surgery she had some initial pain with bowel movement which has now improved.Pain is controlled with motrin and tramadol. She did not tolerate roxicodone. She would like to return to work.  Patient reports some improvement in her preop symptoms. Eating a regular diet without difficulty.   Activity: normal activities of daily living.   Objective: BP 100/72   Ht 5\' 4"  (1.626 m)   Wt 145 lb 6.4 oz (66 kg)   BMI 24.96 kg/m  Physical Exam Constitutional:      Appearance: Normal appearance. She is well-developed.  HENT:     Head: Normocephalic and atraumatic.  Eyes:     Extraocular Movements: Extraocular movements intact.     Pupils: Pupils are equal, round, and reactive to light.  Neck:     Thyroid: No thyromegaly.  Cardiovascular:     Rate and Rhythm: Normal rate and regular rhythm.     Heart sounds: Normal heart sounds.  Pulmonary:     Effort: Pulmonary effort is normal.     Breath sounds: Normal breath sounds.  Abdominal:     General: Bowel sounds are normal. There is no distension.     Palpations: Abdomen is soft. There is no mass.     Comments: Incisions are clean dry and intact  Musculoskeletal:     Cervical back: Neck supple.  Neurological:     Mental Status: She is alert and oriented to person, place, and time.  Skin:    General: Skin is warm and dry.  Psychiatric:        Behavior: Behavior normal.        Thought Content: Thought content normal.        Judgment: Judgment normal.  Vitals reviewed.    Assessment: s/p : [facility] Robot-assisted Laparoscopic Endometriosis Resection  - stable  Plan:  Patient has done well after surgery with no apparent complications.   Reviewed options for management of endometriosis. She is interesting in trying extended cycle OCP. She has had difficulty  with OCP in the past and reports episodes of extended 3 weeks of bleeding when on a monthly OCP.   I have discussed the post-operative course to date, and the expected progress moving forward.  The patient understands what complications to be concerned about.  I will see the patient in routine follow up, or sooner if needed.    Activity plan: No heavy lifting.  MD, [facility], [facility] 05/08/2021 2:29 PM

## 2021-05-17 NOTE — Op Note (Signed)
Operative Note   PRE-OP DIAGNOSIS: Pelvic Pain, Thickened Endometrium, Right Ovarian Cyst  POST-OP DIAGNOSIS: Pelvic Pain, Thickened Endometrium, Endometriosis, Right Ovarian Cyst  SURGEON: Yolanda Huffstetler MD  ASSISTANT:  None  ANESTHESIA: General  PROCEDURE:  DILATATION AND CURETTAGE /HYSTEROSCOPY XI ROBOTIC ASSISTED LAPAROSCOPIC OVARIAN CYSTECTOMY, ENDOMETRIOSIS RESECTION   ESTIMATED BLOOD LOSS: Minimal  DRAINS: Foley  SPECIMENS: Endometriosis implants, Endometrial curettings, Right Ovarian Cyst Wall  COMPLICATIONS: None  DISPOSITION: PACU  CONDITION: Stable  INDICATIONS: Pelvic pain, thickened endometrium  FINDINGS: Exam under anesthesia revealed an 8 week uterus. There was no adnexal masses or nodularity. The parametria was smooth. The cervix was negative for gross lesions. Intraoperative findings included: The uterus was  grossly normal. The adnexa were normal bilaterally. The upper abdomen was normal including omentum, bowel, liver, stomach, and diaphragmatic surfaces. There was no evidence of grossly enlarged pelvic or right para-aortic lymph nodes. Endometriosis implants were seen in the cul-de-sac , left side wall, and along the bilateral fallopian tubes.  PROCEDURE IN DETAIL: After informed consent was obtained, the patient was taken to the operating room where anesthesia was obtained without difficulty. The patient was positioned in the dorsal lithotomy position in [place] stirrups and her arms were carefully tucked at her sides and the usual precautions were taken.  She was prepped and draped in normal sterile fashion.  Time-out was performed. A foley catheter was placed. A speculum was placed in the vagina and the cervical os was dilated. The uterus sounded to 8 cm.  A standard uterine manipulator was then placed in the uterus without incident.    Laparoscopic entry was obtained via an umbilical incision and direct entry. The 8 mm robotic optiview port was placed,  abdomen insuffulated, and pelvis visualized with noted findings above.  The patient was placed in Trendelenburg and the bowel was displaced up into the upper abdomen.  The 3 additional robotic port were placed in a horizontal line across the upper abdomen. Robotic docking was performed.  The right and left fallopian tubes were identified. The laparoscopic scissors and bipolar graspers were used to excise the right ovarian cysts and endometriosis implants.  Excellent hemostasis was noted.      The instruments were then all removed from the abdomen. The robot was undocked. The air was expelled from the abdomen. The trocars were removed. The skin was closed with 4-0 monocryl with a subcuticular stitch and Indermil glue.    Attention was then turned to the hysteroscopy. The uterine manipulator was removed. The weighted speculum was placed inside the patient's vagina, and the the anterior lip of the cervix was seen and grasped with the tenaculum.  The uterine cavity was sounded to 8 cm, and then the cervix was progressively dilated to a 16 French-Pratt dilator. The 0 degree hysteroscope was introduced, with saline fluid used to distend the intrauterine cavity, with the above noted normal findings.  The hysteroscope was removed.  The uterine cavity was curetted until a gritty texture was noted, yielding endometrial curettings. All instruments were removed, with excellent hemostasis noted throughout. She was then taken out of dorsal lithotomy. Minimal discrepancy in fluid was noted.  The patient tolerated the procedure well.  Sponge, lap and needle counts were correct x2.  The patient was taken to recovery room in excellent condition.  The foley was removed from the bladder.   Adelene Idler MD, [facility], [facility] 05/17/2021 9:25 PM

## 2021-05-30 ENCOUNTER — Ambulatory Visit: Payer: 59 | Admitting: Obstetrics and Gynecology

## 2022-05-27 ENCOUNTER — Encounter: Payer: Self-pay | Admitting: *Deleted

## 2022-09-30 IMAGING — US US PELVIS COMPLETE WITH TRANSVAGINAL
1 series · 13 of 25 positions shown · non-contrast
Comparison: Prior ultrasound from 03/04/2006.

CLINICAL DATA: Initial evaluation for menorrhagia.



[Series 1: us pelvis complete with transvaginal · 0.20mm/px · 54 acquisitions, 13 frames shown]
[im 1/54]
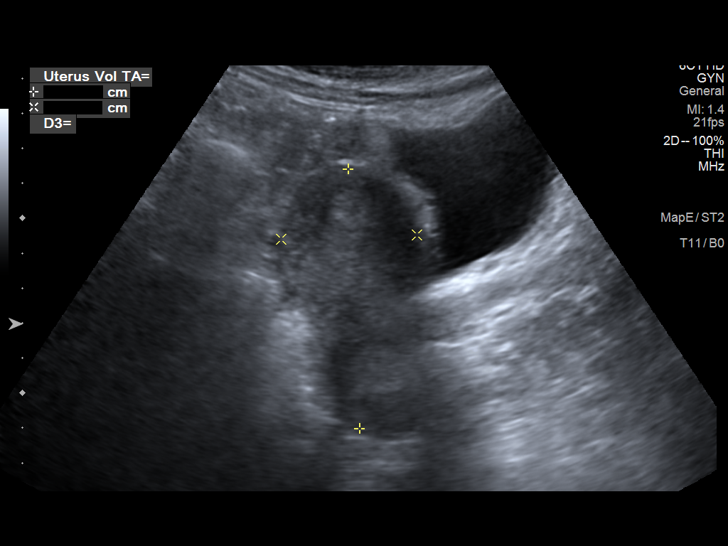
[im 5/54]
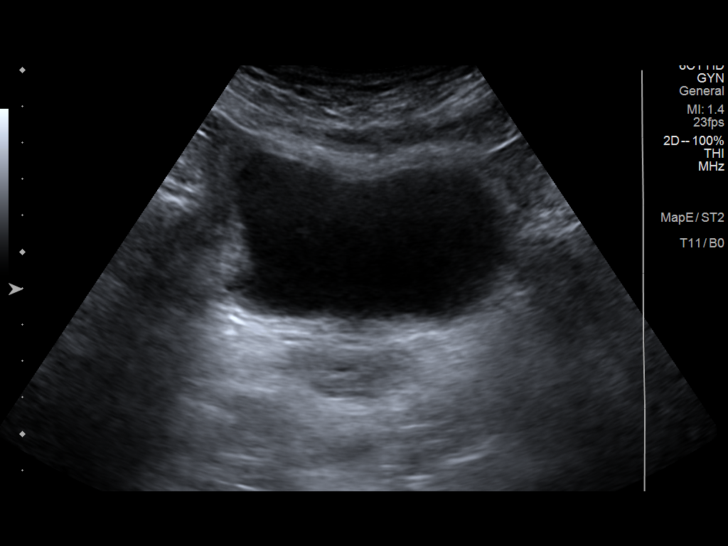
[im 9/54]
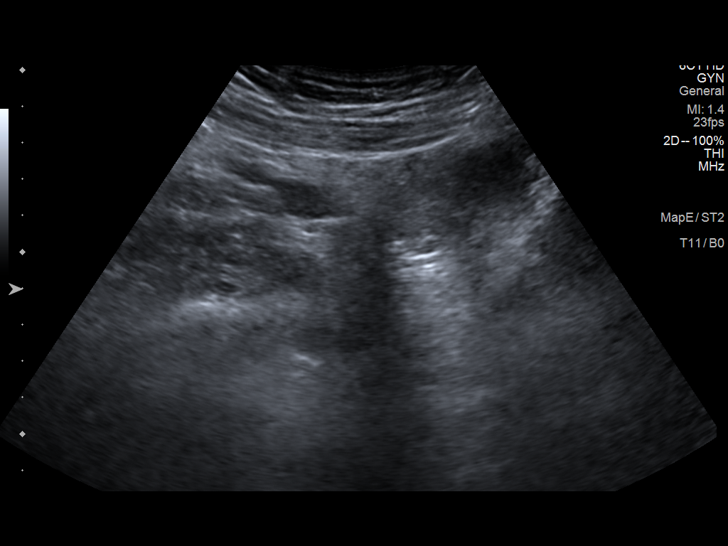
[im 14/54]
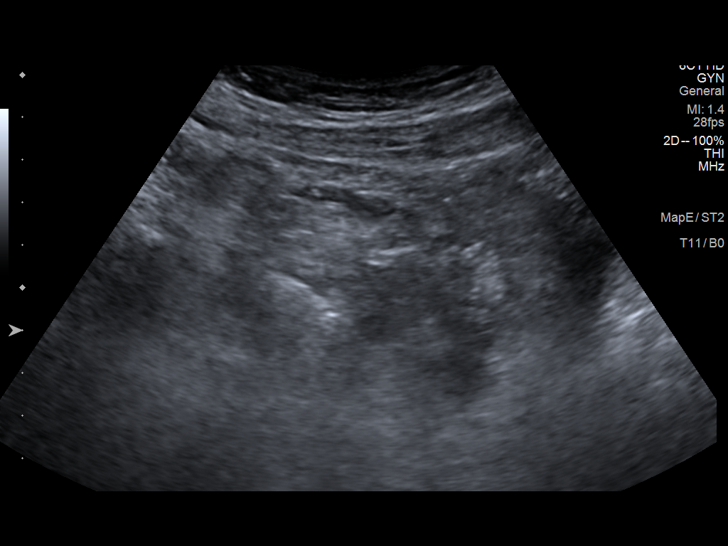
[im 18/54]
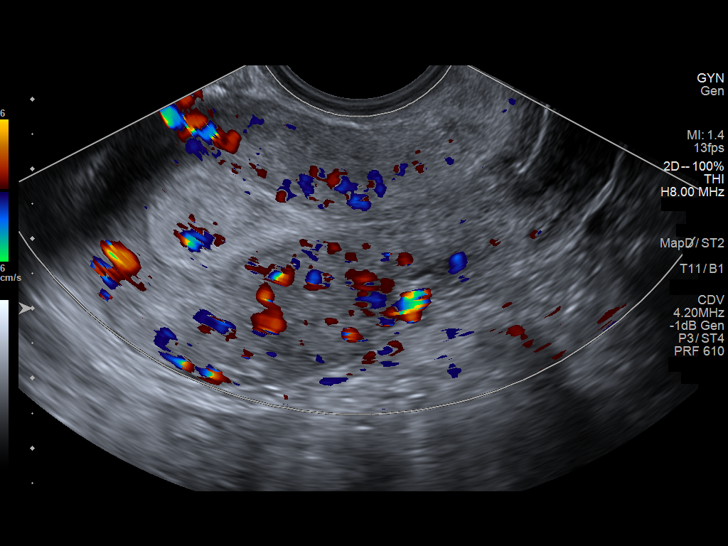
[im 23/54]
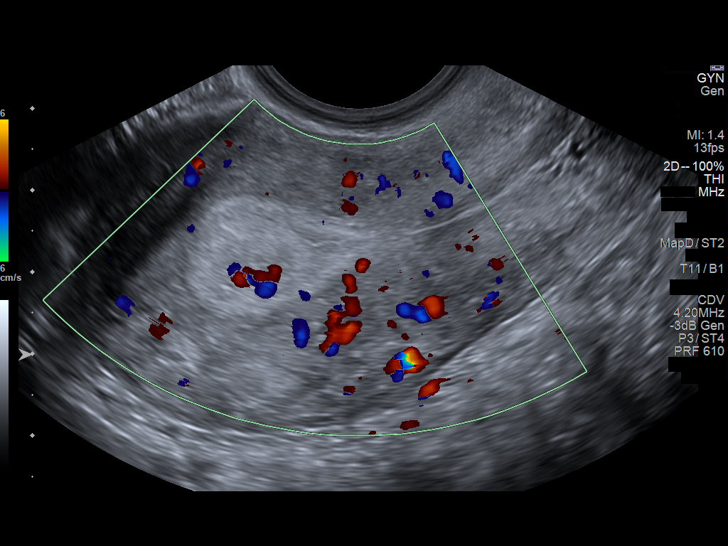
[im 27/54]
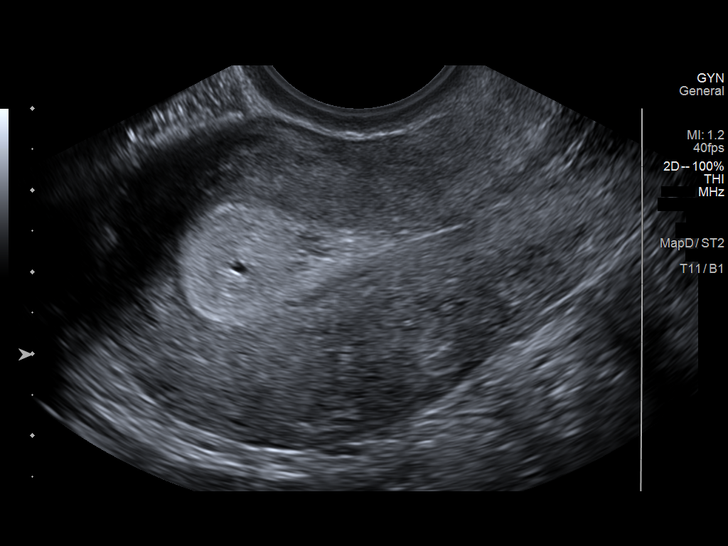
[im 31/54]
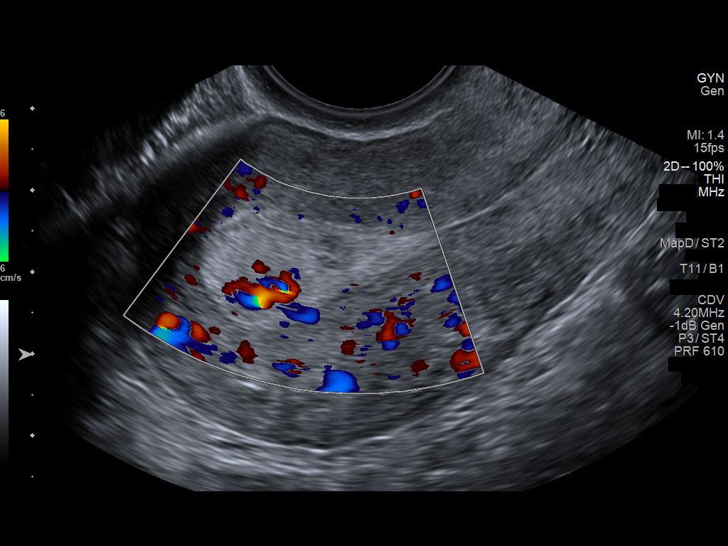
[im 36/54]
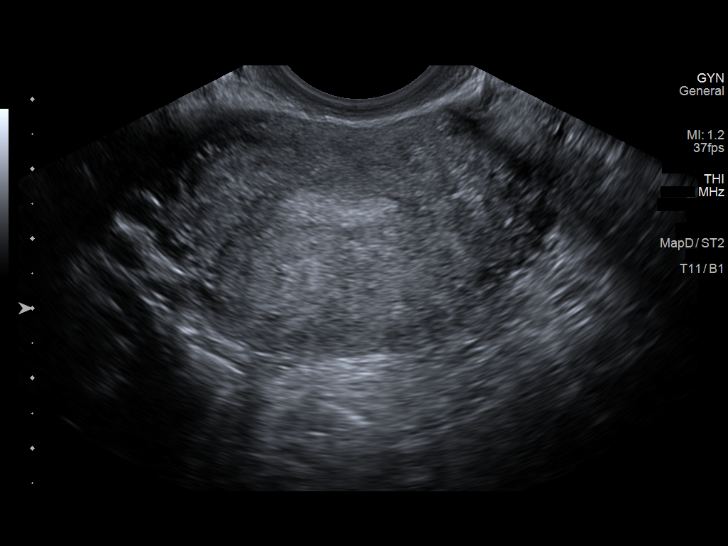
[im 40/54]
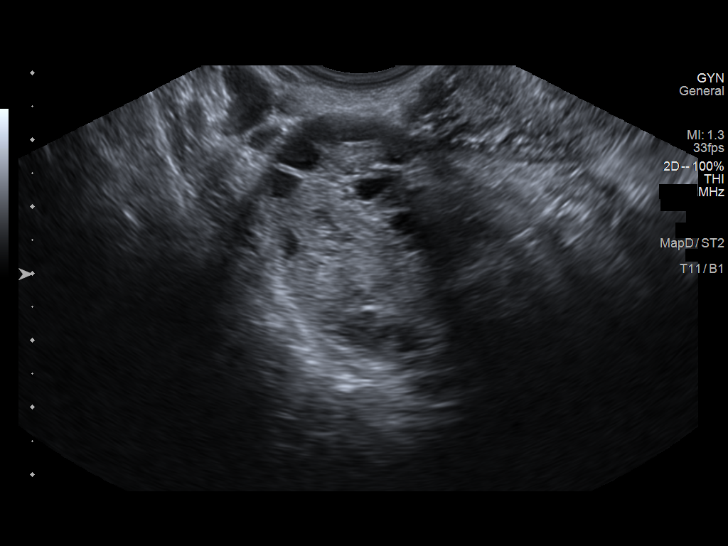
[im 45/54]
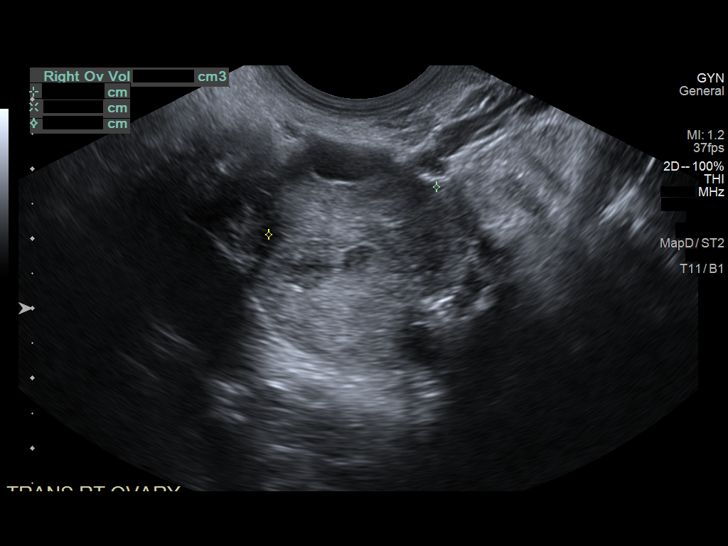
[im 49/54]
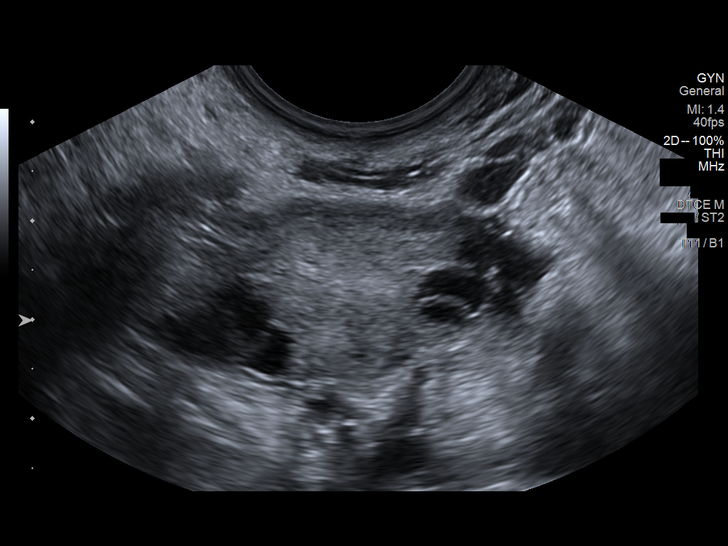
[im 54/54]
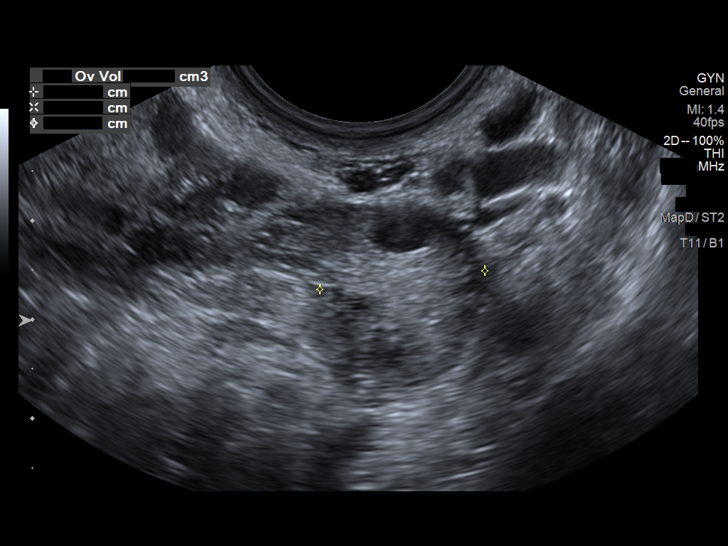

[13 of 25 positions shown; findings below may reference images not displayed]

FINDINGS: Uterus

Measurements: 8.0 x 4.2 x 5.3 cm = volume: 93.0 mL. Uterus is
anteverted. No discrete fibroid or other mass.

Endometrium

Endometrial complex measures up to 14.1 mm. There is a superimposed
ovoid echogenic masslike area measuring approximately 1.3 x 1.0 x
3.1 cm. This involves the endometrial complex at the level of the
uterine fundus (image 26). Evidence for associated vascularity
(images 28, 29).

Right ovary

Measurements: 3.8 x 2.4 x 2.5 cm = volume: 12.2 mL. 2.2 x 1.7 x
cm complex hypoechoic cyst, which could reflect a degenerating
corpus luteal cyst versus hemorrhagic cyst.

Left ovary

Measurements: 3.3 x 1.8 x 1.7 cm = volume: 5.0 mL. Normal
appearance/no adnexal mass.

Other findings

No abnormal free fluid.
IMPRESSION: 1. 1.3 x 1.0 x 3.1 cm echogenic mass-like area involving the
endometrial complex with associated vascularity. Finding is
nonspecific, but could reflect an endometrial polyp, endometrial
hyperplasia, or possibly endometrial carcinoma. Consider further
evaluation with sonohysterogram for confirmation prior to
hysteroscopy. Endometrial sampling should also be considered if
patient is at high risk for endometrial carcinoma. (Ref:
Radiological Reasoning: Algorithmic Workup of Abnormal Vaginal
Bleeding with Endovaginal Sonography and Sonohysterography. AJR
2996; 191:[id number]).
2. 2.3 cm complex right ovarian cyst, which could reflect a
degenerating corpus luteal cyst versus hemorrhagic cyst. While this
is almost certainly benign, a short interval follow-up ultrasound in
6-12 weeks could be performed for further evaluation as warranted.
3. Normal sonographic appearance of the uterus and left ovary. No
free fluid.
